# Patient Record
Sex: Female | Born: 1980 | Race: White | Marital: Married | Smoking: Never smoker
Health system: Southern US, Community
[De-identification: ages and names within clinical notes are randomized; demographics above are authoritative.]

## PROBLEM LIST (undated history)

## (undated) DIAGNOSIS — R42 Dizziness and giddiness: Secondary | ICD-10-CM

## (undated) DIAGNOSIS — F419 Anxiety disorder, unspecified: Secondary | ICD-10-CM

## (undated) HISTORY — DX: Dizziness and giddiness: R42

## (undated) HISTORY — DX: Anxiety disorder, unspecified: F41.9

---

## 2004-05-09 ENCOUNTER — Emergency Department: Payer: Self-pay | Admitting: Emergency Medicine

## 2005-04-25 ENCOUNTER — Other Ambulatory Visit: Payer: Self-pay

## 2005-04-25 ENCOUNTER — Emergency Department: Payer: Self-pay | Admitting: Emergency Medicine

## 2011-05-03 DIAGNOSIS — R42 Dizziness and giddiness: Secondary | ICD-10-CM

## 2011-05-03 HISTORY — DX: Dizziness and giddiness: R42

## 2011-05-24 ENCOUNTER — Ambulatory Visit (INDEPENDENT_AMBULATORY_CARE_PROVIDER_SITE_OTHER): Payer: BC Managed Care – PPO | Admitting: Obstetrics and Gynecology

## 2011-05-24 ENCOUNTER — Encounter: Payer: Self-pay | Admitting: Obstetrics and Gynecology

## 2011-05-24 ENCOUNTER — Inpatient Hospital Stay (HOSPITAL_COMMUNITY): Admission: RE | Admit: 2011-05-24 | Payer: Self-pay | Source: Ambulatory Visit | Admitting: Obstetrics and Gynecology

## 2011-05-24 VITALS — BP 109/70 | HR 75 | Ht 62.0 in | Wt 136.0 lb

## 2011-05-24 DIAGNOSIS — Z01419 Encounter for gynecological examination (general) (routine) without abnormal findings: Secondary | ICD-10-CM

## 2011-05-24 DIAGNOSIS — Z1272 Encounter for screening for malignant neoplasm of vagina: Secondary | ICD-10-CM

## 2011-05-24 MED ORDER — NORETHIN ACE-ETH ESTRAD-FE 1-20 MG-MCG PO TABS: 1.0000 | ORAL_TABLET | Freq: Every day | ORAL | Status: DC

## 2011-05-24 NOTE — Patient Instructions (Signed)
Preventative Care for Adults, Female A healthy lifestyle and preventative care can promote health and wellness. Preventative health guidelines for women include the following key practices:  A routine yearly physical is a good way to check with your caregiver about your health and preventative screening. It is a chance to share any concerns and updates on your health, and to receive a thorough exam.   Visit your dentist for a routine exam and preventative care every 6 months. Brush your teeth twice a day and floss once a day. Good oral hygiene prevents tooth decay and gum disease.   The frequency of eye exams is based on your age, health, family medical history, use of contact lenses, and other factors. Follow your caregiver's recommendations for frequency of eye exams.   Eat a healthy diet. Foods like vegetables, fruits, whole grains, low-fat dairy products, and lean protein foods contain the nutrients you need without too many calories. Decrease your intake of foods high in solid fats, added sugars, and salt. Eat the right amount of calories for you.Get information about a proper diet from your caregiver, if necessary.   Regular physical exercise is one of the most important things you can do for your health. Most adults should get at least 150 minutes of moderate-intensity exercise (any activity that increases your heart rate and causes you to sweat) each week. In addition, most adults need muscle-strengthening exercises on 2 or more days a week.   Maintain a healthy weight. The body mass index (BMI) is a screening tool to identify possible weight problems. It provides an estimate of body fat based on height and weight. Your caregiver can help determine your BMI, and can help you achieve or maintain a healthy weight.For adults 20 years and older:   A BMI below 18.5 is considered underweight.   A BMI of 18.5 to 24.9 is normal.   A BMI of 25 to 29.9 is considered overweight.   A BMI of 30 and  above is considered obese.   Maintain normal blood lipids and cholesterol levels by exercising and minimizing your intake of saturated fat. Eat a balanced diet with plenty of fruit and vegetables. Blood tests for lipids and cholesterol should begin at age 20 and be repeated every 5 years. If your lipid or cholesterol levels are high, you are over 50, or you are a high risk for heart disease, you may need your cholesterol levels checked more frequently.Ongoing high lipid and cholesterol levels should be treated with medicines if diet and exercise are not effective.   If you smoke, find out from your caregiver how to quit. If you do not use tobacco, do not start.   If you are pregnant, do not drink alcohol. If you are breastfeeding, be very cautious about drinking alcohol. If you are not pregnant and choose to drink alcohol, do not exceed 1 drink per day. One drink is considered to be 12 ounces (355 mL) of beer, 5 ounces (148 mL) of wine, or 1.5 ounces (44 mL) of liquor.   Avoid use of street drugs. Do not share needles with anyone. Ask for help if you need support or instructions about stopping the use of drugs.   High blood pressure causes heart disease and increases the risk of stroke. Your blood pressure should be checked at least every 1 to 2 years. Ongoing high blood pressure should be treated with medicines if weight loss and exercise are not effective.   If you are 55 to 32   years old, ask your caregiver if you should take aspirin to prevent strokes.   Diabetes screening involves taking a blood sample to check your fasting blood sugar level. This should be done once every 3 years, after age 45, if you are within normal weight and without risk factors for diabetes. Testing should be considered at a younger age or be carried out more frequently if you are overweight and have at least 1 risk factor for diabetes.   Breast cancer screening is essential preventative care for women. You should  practice "breast self-awareness." This means understanding the normal appearance and feel of your breasts and may include breast self-examination. Any changes detected, no matter how small, should be reported to a caregiver. Women in their 20s and 30s should have a clinical breast exam (CBE) by a caregiver as part of a regular health exam every 1 to 3 years. After age 40, women should have a CBE every year. Starting at age 40, women should consider having a mammogram (breast X-ray) every year. Women who have a family history of breast cancer should talk to their caregiver about genetic screening. Women at a high risk of breast cancer should talk to their caregiver about having an MRI and a mammogram every year.   The Pap test is a screening test for cervical cancer. A Pap test can show cell changes on the cervix that might become cervical cancer if left untreated. A Pap test is a procedure in which cells are obtained and examined from the lower end of the uterus (cervix).   Women should have a Pap test starting at age 21.   Between ages 21 and 29, Pap tests should be repeated every 2 years.   Beginning at age 30, you should have a Pap test every 3 years as long as the past 3 Pap tests have been normal.   Some women have medical problems that increase the chance of getting cervical cancer. Talk to your caregiver about these problems. It is especially important to talk to your caregiver if a new problem develops soon after your last Pap test. In these cases, your caregiver may recommend more frequent screening and Pap tests.   The above recommendations are the same for women who have or have not gotten the vaccine for human papillomavirus (HPV).   If you had a hysterectomy for a problem that was not cancer or a condition that could lead to cancer, then you no longer need Pap tests. Even if you no longer need a Pap test, a regular exam is a good idea to make sure no other problems are starting.   If you  are between ages 65 and 70, and you have had normal Pap tests going back 10 years, you no longer need Pap tests. Even if you no longer need a Pap test, a regular exam is a good idea to make sure no other problems are starting.   If you have had past treatment for cervical cancer or a condition that could lead to cancer, you need Pap tests and screening for cancer for at least 20 years after your treatment.   If Pap tests have been discontinued, risk factors (such as a new sexual partner) need to be reassessed to determine if screening should be resumed.   The HPV test is an additional test that may be used for cervical cancer screening. The HPV test looks for the virus that can cause the cell changes on the cervix. The cells collected   during the Pap test can be tested for HPV. The HPV test could be used to screen women aged 30 years and older, and should be used in women of any age who have unclear Pap test results. After the age of 30, women should have HPV testing at the same frequency as a Pap test.   Colorectal cancer can be detected and often prevented. Most routine colorectal cancer screening begins at the age of 50 and continues through age 75. However, your caregiver may recommend screening at an earlier age if you have risk factors for colon cancer. On a yearly basis, your caregiver may provide home test kits to check for hidden blood in the stool. Use of a small camera at the end of a tube, to directly examine the colon (sigmoidoscopy or colonoscopy), can detect the earliest forms of colorectal cancer. Talk to your caregiver about this at age 50, when routine screening begins. Direct examination of the colon should be repeated every 5 to 10 years through age 75, unless early forms of pre-cancerous polyps or small growths are found.   Practice safe sex. Use condoms and avoid high-risk sexual practices to reduce the spread of sexually transmitted infections (STIs). STIs include gonorrhea,  chlamydia, syphilis, trichomonas, herpes, HPV, and human immunodeficiency virus (HIV). Herpes, HIV, and HPV are viral illnesses that have no cure. They can result in disability, cancer, and death. Sexually active women aged 25 and younger should be checked for Chlamydia. Older women with new or multiple partners should also be tested for Chlamydia. Testing for other STIs is recommended if you are sexually active and at increased risk.   Osteoporosis is a disease in which the bones lose minerals and strength with aging. This can result in serious bone fractures. The risk of osteoporosis can be identified using a bone density scan. Women ages 65 and over and women at risk for fractures or osteoporosis should discuss screening with their caregivers. Ask your caregiver whether you should take a calcium supplement or vitamin D to reduce the rate of osteoporosis.   Menopause can be associated with physical symptoms and risks. Hormone replacement therapy is available to decrease symptoms and risks. You should talk to your caregiver about whether hormone replacement therapy is right for you.   Use sunscreen with skin protection factor (SPF) of 30 or more. Apply sunscreen liberally and repeatedly throughout the day. You should seek shade when your shadow is shorter than you. Protect yourself by wearing long sleeves, pants, a wide-brimmed hat, and sunglasses year round, whenever you are outdoors.   Once a month, do a whole body skin exam, using a mirror to look at the skin on your back. Notify your caregiver of new moles, moles that have irregular borders, moles that are larger than a pencil eraser, or moles that have changed in shape or color.   Stay current with required immunizations.   Influenza. You need a dose every fall (or winter). The composition of the flu vaccine changes each year, so being vaccinated once is not enough.   Pneumococcal polysaccharide. You need 1 to 2 doses if you smoke cigarettes or  if you have certain chronic medical conditions. You need 1 dose at age 65 (or older) if you have never been vaccinated.   Tetanus, diphtheria, pertussis (Tdap, Td). Get 1 dose of Tdap vaccine if you are younger than age 65 years, are over 65 and have contact with an infant, are a healthcare worker, are pregnant, or simply want   to be protected from whooping cough. After that, you need a Td booster dose every 10 years. Consult your caregiver if you have not had at least 3 tetanus and diphtheria-containing shots sometime in your life or have a deep or dirty wound.   HPV. You need this vaccine if you are a woman age 24 years or younger. The vaccine is given in 3 doses over 6 months.   Measles, mumps, rubella (MMR). You need at least 1 dose of MMR if you were born in 1957 or later. You may also need a 2nd dose.   Meningococcal. If you are age 78 to 97 years and a Orthoptist living in a residence hall, or have one of several medical conditions, you need to get vaccinated against meningococcal disease. You may also need additional booster doses.   Zoster (shingles). If you are age 21 years or older, you should get this vaccine.   Varicella (chickenpox). If you have never had chickenpox or you were vaccinated but received only 1 dose, talk to your caregiver to find out if you need this vaccine.   Hepatitis A. You need this vaccine if you have a specific risk factor for hepatitis A virus infection or you simply wish to be protected from this disease. The vaccine is usually given as 2 doses, 6 to 18 months apart.   Hepatitis B. You need this vaccine if you have a specific risk factor for hepatitis B virus infection or you simply wish to be protected from this disease. The vaccine is given in 3 doses, usually over 6 months.  Preventative Services / Frequency Ages 87 to 34  Blood pressure check.** / Every 1 to 2 years.   Lipid and cholesterol check.**/ Every 5 years beginning at age 15.    Clinical breast exam.** / Every 3 years for women in their 37s and 30s.   Pap Test.** / Every 2 years from ages 34 through 45. Every 3 years starting at age 66 years through age 25 or 76 with a history of 3 consecutive normal Pap tests.   HPV Screening.** / Every 3 years from ages 46 through ages 24 to 26 with a history of 3 consecutive normal Pap tests.   Skin self-exam. / Monthly.   Influenza immunization.** / Every year.   Pneumococcal polysaccharide immunization.** / 1 to 2 doses if you smoke cigarettes or if you have certain chronic medical conditions.   Tetanus, diphtheria, pertussis (Tdap,Td) immunization. / A one-time dose of Tdap vaccine. After that, you need a Td booster dose every 10 years.   HPV immunization. / 3 doses over 6 months, if 26 and younger.   Measles, mumps, rubella (MMR) immunization. / You need at least 1 dose of MMR if you were born in 1957 or later. You may also need a 2nd dose.   Meningococcal immunization. / 1 dose if you are age 34 to 18 years and a Orthoptist living in a residence hall, or have one of several medical conditions, you need to get vaccinated against meningococcal disease. You may also need additional booster doses.   Varicella immunization. **/ Consult your caregiver.   Hepatitis A immunization. ** / Consult your caregiver. 2 doses, 6 to 18 months apart.   Hepatitis B immunization.** / Consult your caregiver. 3 doses usually over 6 months.    ** Family history and personal history of risk and conditions may change your caregiver's recommendations. Document Released: 06/14/2001 Document Revised: 12/29/2010 Document  Reviewed: 09/13/2010 [employer] Patient Information 2012 [place], [place].

## 2011-05-24 NOTE — Progress Notes (Signed)
  Subjective:     Stephanie Taylor is a 31 y.o. female G1P1 with LMP 05/17/2011 with BMI 24 and is here for a comprehensive physical exam. The patient reports no problems. Patient using Lo-Loestrin for birth control and is happy with this current method. Patient is otherwise without any complaints. Patient is in a monogamous relationship and is not interested in STD testing  History   Social History  . Marital Status: Married    Spouse Name: N/A    Number of Children: N/A  . Years of Education: N/A   Occupational History  . Not on file.   Social History Main Topics  . Smoking status: Never Smoker   . Smokeless tobacco: Not on file  . Alcohol Use: Yes     occasion  . Drug Use: No  . Sexually Active: Yes -- Female partner(s)    Birth Control/ Protection: Pill   Other Topics Concern  . Not on file   Social History Narrative  . No narrative on file   No health maintenance topics applied.     Review of Systems A comprehensive review of systems was negative.   Objective:   GENERAL: Well-developed, well-nourished female in no acute distress.  HEENT: Normocephalic, atraumatic. Sclerae anicteric.  NECK: Supple. Normal thyroid.  LUNGS: Clear to auscultation bilaterally.  HEART: Regular rate and rhythm. BREASTS: Symmetric in size. No palpable masses or lymphadenopathy, skin changes, or nipple drainage. ABDOMEN: Soft, nontender, nondistended. No organomegaly. PELVIC: Normal external female genitalia. Vagina is pink and rugated.  Normal discharge. Normal appearing cervix. Uterus is normal in size. No adnexal mass or tenderness. EXTREMITIES: No cyanosis, clubbing, or edema, 2+ distal pulses.    Assessment:    Healthy female exam.     Plan:   Pap smear was performed Patient provided with refill of birth control pill Patient advised to perform monthly breast exams Patient advised to exercise regularly and eat high fiber/low fat diet RTC in a year or prn See After Visit  Summary for Counseling Recommendations

## 2011-06-02 ENCOUNTER — Telehealth: Payer: Self-pay | Admitting: *Deleted

## 2011-06-02 NOTE — Telephone Encounter (Signed)
Paperwork for med refill filled out and faxed

## 2011-08-04 ENCOUNTER — Other Ambulatory Visit: Payer: Self-pay | Admitting: Gynecology

## 2011-08-04 DIAGNOSIS — Z309 Encounter for contraceptive management, unspecified: Secondary | ICD-10-CM

## 2011-08-04 MED ORDER — NORETHIN ACE-ETH ESTRAD-FE 1-20 MG-MCG PO TABS: 1.0000 | ORAL_TABLET | Freq: Every day | ORAL | Status: DC

## 2012-06-15 ENCOUNTER — Ambulatory Visit: Payer: BC Managed Care – PPO | Admitting: Obstetrics and Gynecology

## 2012-06-21 ENCOUNTER — Encounter: Payer: Self-pay | Admitting: Family Medicine

## 2012-06-21 ENCOUNTER — Ambulatory Visit (INDEPENDENT_AMBULATORY_CARE_PROVIDER_SITE_OTHER): Payer: BC Managed Care – PPO | Admitting: Family Medicine

## 2012-06-21 VITALS — BP 111/69 | HR 75 | Ht 63.0 in | Wt 141.0 lb

## 2012-06-21 DIAGNOSIS — Z124 Encounter for screening for malignant neoplasm of cervix: Secondary | ICD-10-CM

## 2012-06-21 DIAGNOSIS — Z3041 Encounter for surveillance of contraceptive pills: Secondary | ICD-10-CM | POA: Insufficient documentation

## 2012-06-21 DIAGNOSIS — Z01419 Encounter for gynecological examination (general) (routine) without abnormal findings: Secondary | ICD-10-CM

## 2012-06-21 DIAGNOSIS — Z1151 Encounter for screening for human papillomavirus (HPV): Secondary | ICD-10-CM

## 2012-06-21 MED ORDER — INFLUENZA VIRUS VACC SPLIT PF IM SUSP: 0.5000 mL | Freq: Once | INTRAMUSCULAR | Status: DC

## 2012-06-21 NOTE — Patient Instructions (Signed)
Preventive Care for Adults, Female A healthy lifestyle and preventive care can promote health and wellness. Preventive health guidelines for women include the following key practices.  A routine yearly physical is a good way to check with your caregiver about your health and preventive screening. It is a chance to share any concerns and updates on your health, and to receive a thorough exam.  Visit your dentist for a routine exam and preventive care every 6 months. Brush your teeth twice a day and floss once a day. Good oral hygiene prevents tooth decay and gum disease.  The frequency of eye exams is based on your age, health, family medical history, use of contact lenses, and other factors. Follow your caregiver's recommendations for frequency of eye exams.  Eat a healthy diet. Foods like vegetables, fruits, whole grains, low-fat dairy products, and lean protein foods contain the nutrients you need without too many calories. Decrease your intake of foods high in solid fats, added sugars, and salt. Eat the right amount of calories for you.Get information about a proper diet from your caregiver, if necessary.  Regular physical exercise is one of the most important things you can do for your health. Most adults should get at least 150 minutes of moderate-intensity exercise (any activity that increases your heart rate and causes you to sweat) each week. In addition, most adults need muscle-strengthening exercises on 2 or more days a week.  Maintain a healthy weight. The body mass index (BMI) is a screening tool to identify possible weight problems. It provides an estimate of body fat based on height and weight. Your caregiver can help determine your BMI, and can help you achieve or maintain a healthy weight.For adults 20 years and older:  A BMI below 18.5 is considered underweight.  A BMI of 18.5 to 24.9 is normal.  A BMI of 25 to 29.9 is considered overweight.  A BMI of 30 and above is  considered obese.  Maintain normal blood lipids and cholesterol levels by exercising and minimizing your intake of saturated fat. Eat a balanced diet with plenty of fruit and vegetables. Blood tests for lipids and cholesterol should begin at age 20 and be repeated every 5 years. If your lipid or cholesterol levels are high, you are over 50, or you are at high risk for heart disease, you may need your cholesterol levels checked more frequently.Ongoing high lipid and cholesterol levels should be treated with medicines if diet and exercise are not effective.  If you smoke, find out from your caregiver how to quit. If you do not use tobacco, do not start.  If you are pregnant, do not drink alcohol. If you are breastfeeding, be very cautious about drinking alcohol. If you are not pregnant and choose to drink alcohol, do not exceed 1 drink per day. One drink is considered to be 12 ounces (355 mL) of beer, 5 ounces (148 mL) of wine, or 1.5 ounces (44 mL) of liquor.  Avoid use of street drugs. Do not share needles with anyone. Ask for help if you need support or instructions about stopping the use of drugs.  High blood pressure causes heart disease and increases the risk of stroke. Your blood pressure should be checked at least every 1 to 2 years. Ongoing high blood pressure should be treated with medicines if weight loss and exercise are not effective.  If you are 55 to 33 years old, ask your caregiver if you should take aspirin to prevent strokes.  Diabetes   screening involves taking a blood sample to check your fasting blood sugar level. This should be done once every 3 years, after age 45, if you are within normal weight and without risk factors for diabetes. Testing should be considered at a younger age or be carried out more frequently if you are overweight and have at least 1 risk factor for diabetes.  Breast cancer screening is essential preventive care for women. You should practice "breast  self-awareness." This means understanding the normal appearance and feel of your breasts and may include breast self-examination. Any changes detected, no matter how small, should be reported to a caregiver. Women in their 20s and 30s should have a clinical breast exam (CBE) by a caregiver as part of a regular health exam every 1 to 3 years. After age 40, women should have a CBE every year. Starting at age 40, women should consider having a mammography (breast X-ray test) every year. Women who have a family history of breast cancer should talk to their caregiver about genetic screening. Women at a high risk of breast cancer should talk to their caregivers about having magnetic resonance imaging (MRI) and a mammography every year.  The Pap test is a screening test for cervical cancer. A Pap test can show cell changes on the cervix that might become cervical cancer if left untreated. A Pap test is a procedure in which cells are obtained and examined from the lower end of the uterus (cervix).  Women should have a Pap test starting at age 21.  Between ages 21 and 29, Pap tests should be repeated every 2 years.  Beginning at age 30, you should have a Pap test every 3 years as long as the past 3 Pap tests have been normal.  Some women have medical problems that increase the chance of getting cervical cancer. Talk to your caregiver about these problems. It is especially important to talk to your caregiver if a new problem develops soon after your last Pap test. In these cases, your caregiver may recommend more frequent screening and Pap tests.  The above recommendations are the same for women who have or have not gotten the vaccine for human papillomavirus (HPV).  If you had a hysterectomy for a problem that was not cancer or a condition that could lead to cancer, then you no longer need Pap tests. Even if you no longer need a Pap test, a regular exam is a good idea to make sure no other problems are  starting.  If you are between ages 65 and 70, and you have had normal Pap tests going back 10 years, you no longer need Pap tests. Even if you no longer need a Pap test, a regular exam is a good idea to make sure no other problems are starting.  If you have had past treatment for cervical cancer or a condition that could lead to cancer, you need Pap tests and screening for cancer for at least 20 years after your treatment.  If Pap tests have been discontinued, risk factors (such as a new sexual partner) need to be reassessed to determine if screening should be resumed.  The HPV test is an additional test that may be used for cervical cancer screening. The HPV test looks for the virus that can cause the cell changes on the cervix. The cells collected during the Pap test can be tested for HPV. The HPV test could be used to screen women aged 30 years and older, and should   be used in women of any age who have unclear Pap test results. After the age of 30, women should have HPV testing at the same frequency as a Pap test.  Colorectal cancer can be detected and often prevented. Most routine colorectal cancer screening begins at the age of 50 and continues through age 75. However, your caregiver may recommend screening at an earlier age if you have risk factors for colon cancer. On a yearly basis, your caregiver may provide home test kits to check for hidden blood in the stool. Use of a small camera at the end of a tube, to directly examine the colon (sigmoidoscopy or colonoscopy), can detect the earliest forms of colorectal cancer. Talk to your caregiver about this at age 50, when routine screening begins. Direct examination of the colon should be repeated every 5 to 10 years through age 75, unless early forms of pre-cancerous polyps or small growths are found.  Hepatitis C blood testing is recommended for all people born from 1945 through 1965 and any individual with known risks for hepatitis C.  Practice  safe sex. Use condoms and avoid high-risk sexual practices to reduce the spread of sexually transmitted infections (STIs). STIs include gonorrhea, chlamydia, syphilis, trichomonas, herpes, HPV, and human immunodeficiency virus (HIV). Herpes, HIV, and HPV are viral illnesses that have no cure. They can result in disability, cancer, and death. Sexually active women aged 25 and younger should be checked for chlamydia. Older women with new or multiple partners should also be tested for chlamydia. Testing for other STIs is recommended if you are sexually active and at increased risk.  Osteoporosis is a disease in which the bones lose minerals and strength with aging. This can result in serious bone fractures. The risk of osteoporosis can be identified using a bone density scan. Women ages 65 and over and women at risk for fractures or osteoporosis should discuss screening with their caregivers. Ask your caregiver whether you should take a calcium supplement or vitamin D to reduce the rate of osteoporosis.  Menopause can be associated with physical symptoms and risks. Hormone replacement therapy is available to decrease symptoms and risks. You should talk to your caregiver about whether hormone replacement therapy is right for you.  Use sunscreen with sun protection factor (SPF) of 30 or more. Apply sunscreen liberally and repeatedly throughout the day. You should seek shade when your shadow is shorter than you. Protect yourself by wearing long sleeves, pants, a wide-brimmed hat, and sunglasses year round, whenever you are outdoors.  Once a month, do a whole body skin exam, using a mirror to look at the skin on your back. Notify your caregiver of new moles, moles that have irregular borders, moles that are larger than a pencil eraser, or moles that have changed in shape or color.  Stay current with required immunizations.  Influenza. You need a dose every fall (or winter). The composition of the flu vaccine  changes each year, so being vaccinated once is not enough.  Pneumococcal polysaccharide. You need 1 to 2 doses if you smoke cigarettes or if you have certain chronic medical conditions. You need 1 dose at age 65 (or older) if you have never been vaccinated.  Tetanus, diphtheria, pertussis (Tdap, Td). Get 1 dose of Tdap vaccine if you are younger than age 65, are over 65 and have contact with an infant, are a healthcare worker, are pregnant, or simply want to be protected from whooping cough. After that, you need a Td   booster dose every 10 years. Consult your caregiver if you have not had at least 3 tetanus and diphtheria-containing shots sometime in your life or have a deep or dirty wound.  HPV. You need this vaccine if you are a woman age 26 or younger. The vaccine is given in 3 doses over 6 months.  Measles, mumps, rubella (MMR). You need at least 1 dose of MMR if you were born in 1957 or later. You may also need a second dose.  Meningococcal. If you are age 19 to 21 and a first-year college student living in a residence hall, or have one of several medical conditions, you need to get vaccinated against meningococcal disease. You may also need additional booster doses.  Zoster (shingles). If you are age 60 or older, you should get this vaccine.  Varicella (chickenpox). If you have never had chickenpox or you were vaccinated but received only 1 dose, talk to your caregiver to find out if you need this vaccine.  Hepatitis A. You need this vaccine if you have a specific risk factor for hepatitis A virus infection or you simply wish to be protected from this disease. The vaccine is usually given as 2 doses, 6 to 18 months apart.  Hepatitis B. You need this vaccine if you have a specific risk factor for hepatitis B virus infection or you simply wish to be protected from this disease. The vaccine is given in 3 doses, usually over 6 months. Preventive Services / Frequency Ages 19 to 39  Blood  pressure check.** / Every 1 to 2 years.  Lipid and cholesterol check.** / Every 5 years beginning at age 20.  Clinical breast exam.** / Every 3 years for women in their 20s and 30s.  Pap test.** / Every 2 years from ages 21 through 29. Every 3 years starting at age 30 through age 65 or 70 with a history of 3 consecutive normal Pap tests.  HPV screening.** / Every 3 years from ages 30 through ages 65 to 70 with a history of 3 consecutive normal Pap tests.  Hepatitis C blood test.** / For any individual with known risks for hepatitis C.  Skin self-exam. / Monthly.  Influenza immunization.** / Every year.  Pneumococcal polysaccharide immunization.** / 1 to 2 doses if you smoke cigarettes or if you have certain chronic medical conditions.  Tetanus, diphtheria, pertussis (Tdap, Td) immunization. / A one-time dose of Tdap vaccine. After that, you need a Td booster dose every 10 years.  HPV immunization. / 3 doses over 6 months, if you are 26 and younger.  Measles, mumps, rubella (MMR) immunization. / You need at least 1 dose of MMR if you were born in 1957 or later. You may also need a second dose.  Meningococcal immunization. / 1 dose if you are age 19 to 21 and a first-year college student living in a residence hall, or have one of several medical conditions, you need to get vaccinated against meningococcal disease. You may also need additional booster doses.  Varicella immunization.** / Consult your caregiver.  Hepatitis A immunization.** / Consult your caregiver. 2 doses, 6 to 18 months apart.  Hepatitis B immunization.** / Consult your caregiver. 3 doses usually over 6 months. Ages 40 to 64  Blood pressure check.** / Every 1 to 2 years.  Lipid and cholesterol check.** / Every 5 years beginning at age 20.  Clinical breast exam.** / Every year after age 40.  Mammogram.** / Every year beginning at age 40   and continuing for as long as you are in good health. Consult with your  caregiver.  Pap test.** / Every 3 years starting at age 30 through age 65 or 70 with a history of 3 consecutive normal Pap tests.  HPV screening.** / Every 3 years from ages 30 through ages 65 to 70 with a history of 3 consecutive normal Pap tests.  Fecal occult blood test (FOBT) of stool. / Every year beginning at age 50 and continuing until age 75. You may not need to do this test if you get a colonoscopy every 10 years.  Flexible sigmoidoscopy or colonoscopy.** / Every 5 years for a flexible sigmoidoscopy or every 10 years for a colonoscopy beginning at age 50 and continuing until age 75.  Hepatitis C blood test.** / For all people born from 1945 through 1965 and any individual with known risks for hepatitis C.  Skin self-exam. / Monthly.  Influenza immunization.** / Every year.  Pneumococcal polysaccharide immunization.** / 1 to 2 doses if you smoke cigarettes or if you have certain chronic medical conditions.  Tetanus, diphtheria, pertussis (Tdap, Td) immunization.** / A one-time dose of Tdap vaccine. After that, you need a Td booster dose every 10 years.  Measles, mumps, rubella (MMR) immunization. / You need at least 1 dose of MMR if you were born in 1957 or later. You may also need a second dose.  Varicella immunization.** / Consult your caregiver.  Meningococcal immunization.** / Consult your caregiver.  Hepatitis A immunization.** / Consult your caregiver. 2 doses, 6 to 18 months apart.  Hepatitis B immunization.** / Consult your caregiver. 3 doses, usually over 6 months. Ages 65 and over  Blood pressure check.** / Every 1 to 2 years.  Lipid and cholesterol check.** / Every 5 years beginning at age 20.  Clinical breast exam.** / Every year after age 40.  Mammogram.** / Every year beginning at age 40 and continuing for as long as you are in good health. Consult with your caregiver.  Pap test.** / Every 3 years starting at age 30 through age 65 or 70 with a 3  consecutive normal Pap tests. Testing can be stopped between 65 and 70 with 3 consecutive normal Pap tests and no abnormal Pap or HPV tests in the past 10 years.  HPV screening.** / Every 3 years from ages 30 through ages 65 or 70 with a history of 3 consecutive normal Pap tests. Testing can be stopped between 65 and 70 with 3 consecutive normal Pap tests and no abnormal Pap or HPV tests in the past 10 years.  Fecal occult blood test (FOBT) of stool. / Every year beginning at age 50 and continuing until age 75. You may not need to do this test if you get a colonoscopy every 10 years.  Flexible sigmoidoscopy or colonoscopy.** / Every 5 years for a flexible sigmoidoscopy or every 10 years for a colonoscopy beginning at age 50 and continuing until age 75.  Hepatitis C blood test.** / For all people born from 1945 through 1965 and any individual with known risks for hepatitis C.  Osteoporosis screening.** / A one-time screening for women ages 65 and over and women at risk for fractures or osteoporosis.  Skin self-exam. / Monthly.  Influenza immunization.** / Every year.  Pneumococcal polysaccharide immunization.** / 1 dose at age 65 (or older) if you have never been vaccinated.  Tetanus, diphtheria, pertussis (Tdap, Td) immunization. / A one-time dose of Tdap vaccine if you are over   65 and have contact with an infant, are a Research scientist (physical sciences), or simply want to be protected from whooping cough. After that, you need a Td booster dose every 10 years.  Varicella immunization.** / Consult your caregiver.  Meningococcal immunization.** / Consult your caregiver.  Hepatitis A immunization.** / Consult your caregiver. 2 doses, 6 to 18 months apart.  Hepatitis B immunization.** / Check with your caregiver. 3 doses, usually over 6 months. ** Family history and personal history of risk and conditions may change your caregiver's recommendations. Document Released: 06/14/2001 Document Revised: 07/11/2011  Document Reviewed: 09/13/2010 [facility] Patient Information 2013 [place], [place]. Sterilization Information, Female Female sterilization is a procedure to permanently prevent pregnancy. There are different ways to perform sterilization, but all either block or close the fallopian tubes so that your eggs cannot reach your uterus. If your egg cannot reach your uterus, sperm cannot fertilize the egg, and you cannot get pregnant.  Sterilization is performed by a surgical procedure. Sometimes these procedures are performed in a hospital while a patient is asleep. Sometimes they can be done in a clinic setting with the patient awake. The fallopian tubes can be surgically cut, tied, or sealed through a procedure called tubal ligation. The fallopian tubes can also be closed with clips or rings. Sterilization can also be done by placing a tiny coil into each fallopian tube, which causes scar tissue to grow inside the tube. The scar tissue then blocks the tubes.  Discuss sterilization with your caregiver to answer any concerns you or your partner may have. You may want to ask what type of sterilization your caregiver performs. Some caregivers may not perform all the various options. Sterilization is permanent and should only be done if you are sure you do not want children or do not want any more children. Having a sterilization reversed may not be successful.  STERILIZATION PROCEDURES  Laparoscopic sterilization. This is a surgical method performed at a time other than right after childbirth. Two incisions are made in the lower abdomen. A thin, lighted tube (laparoscope) is inserted into one of the incisions and is used to perform the procedure. The fallopian tubes are closed with a ring or a clip. An instrument that uses heat could be used to seal the tubes closed (electrocautery).   Mini-laparotomy. This is a surgical method done 1 or 2 days after giving birth. Typically, a small incision is made just below the  belly button (umbilicus) and the fallopian tubes are exposed. The tubes can then be sealed, tied, or cut.   Hysteroscopic sterilization. This is performed at a time other than right after childbirth. A tiny, spring-like coil is inserted through the cervix and uterus and placed into the fallopian tubes. The coil causes scaring and blocks the tubes. Other forms of contraception should be used for 3 months after the procedure to allow the scar tissue to form completely. Additionally, it is required hysterosalpingography be done 3 months later to ensure that the procedure was successful. Hysterosalpingography is a procedure that uses X-rays to look at your uterus and fallopian tubes after a material to make them show up better has been inserted. IS STERILIZATION SAFE? Sterilization is considered safe with very rare complications. Risks depend on the type of procedure you have. As with any surgical procedure, there are risks. Some risks of sterilization by any means include:   Bleeding.  Infection.  Reaction to anesthesia medicine.  Injury to surrounding organs. Risks specific to having hysteroscopic coils placed include:  The coils may not be placed correctly the first time.   The coils may move out of place.   The tubes may not get completely blocked after 3 months.   Injury to surrounding organs when placing the coil.  HOW EFFECTIVE IS FEMALE STERILIZATION? Sterilization is nearly 100% effective, but it can fail. Depending on the type of sterilization, the rate of failure can be as high as 3%. After hysteroscopic sterilization with placement of fallopian tube coils, you will need back-up birth control for 3 months after the procedure. Sterilization is effective for a lifetime.  BENEFITS OF STERILIZATION  It does not affect your hormones, and therefore will not affect your menstrual periods, sexual desire, or performance.   It is effective for a lifetime.   It is safe.   You do  not need to worry about getting pregnant. Keep in mind that if you had the hysteroscopic placement procedure, you must wait 3 months after the procedure (or until your caregiver confirms) before pregnancy is not considered possible.   There are no side effects unlike other types of birth control (contraception).  DRAWBACKS OF STERILIZATION  You must be sure you do not want children or any more children. The procedure is permanent.   It does not provide protection against sexually transmitted infections (STIs).   The tubes can grow back together. If this happens, there is a risk of pregnancy. There is also an increased risk (50%) of pregnancy being an ectopic pregnancy. This is a pregnancy that happens outside of the uterus. Document Released: 10/05/2007 Document Revised: 10/18/2011 Document Reviewed: 08/04/2011 [facility] Patient Information 2013 [place], [place]. Contraception Choices Contraception (birth control) is the use of any methods or devices to prevent pregnancy. Below are some methods to help avoid pregnancy. HORMONAL METHODS   Contraceptive implant. This is a thin, plastic tube containing progesterone hormone. It does not contain estrogen hormone. Your caregiver inserts the tube in the inner part of the upper arm. The tube can remain in place for up to 3 years. After 3 years, the implant must be removed. The implant prevents the ovaries from releasing an egg (ovulation), thickens the cervical mucus which prevents sperm from entering the uterus, and thins the lining of the inside of the uterus.  Progesterone-only injections. These injections are given every 3 months by your caregiver to prevent pregnancy. This synthetic progesterone hormone stops the ovaries from releasing eggs. It also thickens cervical mucus and changes the uterine lining. This makes it harder for sperm to survive in the uterus.  Birth control pills. These pills contain estrogen and progesterone hormone. They work  by stopping the egg from forming in the ovary (ovulation). Birth control pills are prescribed by a caregiver.Birth control pills can also be used to treat heavy periods.  Minipill. This type of birth control pill contains only the progesterone hormone. They are taken every day of each month and must be prescribed by your caregiver.  Birth control patch. The patch contains hormones similar to those in birth control pills. It must be changed once a week and is prescribed by a caregiver.  Vaginal ring. The ring contains hormones similar to those in birth control pills. It is left in the vagina for 3 weeks, removed for 1 week, and then a new one is put back in place. The patient must be comfortable inserting and removing the ring from the vagina.A caregiver's prescription is necessary.  Emergency contraception. Emergency contraceptives prevent pregnancy after unprotected sexual intercourse. This pill  can be taken right after sex or up to 5 days after unprotected sex. It is most effective the sooner you take the pills after having sexual intercourse. Emergency contraceptive pills are available without a prescription. Check with your pharmacist. Do not use emergency contraception as your only form of birth control. BARRIER METHODS   Female condom. This is a thin sheath (latex or rubber) that is worn over the penis during sexual intercourse. It can be used with spermicide to increase effectiveness.  Female condom. This is a soft, loose-fitting sheath that is put into the vagina before sexual intercourse.  Diaphragm. This is a soft, latex, dome-shaped barrier that must be fitted by a caregiver. It is inserted into the vagina, along with a spermicidal jelly. It is inserted before intercourse. The diaphragm should be left in the vagina for 6 to 8 hours after intercourse.  Cervical cap. This is a round, soft, latex or plastic cup that fits over the cervix and must be fitted by a caregiver. The cap can be left  in place for up to 48 hours after intercourse.  Sponge. This is a soft, circular piece of polyurethane foam. The sponge has spermicide in it. It is inserted into the vagina after wetting it and before sexual intercourse.  Spermicides. These are chemicals that kill or block sperm from entering the cervix and uterus. They come in the form of creams, jellies, suppositories, foam, or tablets. They do not require a prescription. They are inserted into the vagina with an applicator before having sexual intercourse. The process must be repeated every time you have sexual intercourse. INTRAUTERINE CONTRACEPTION  Intrauterine device (IUD). This is a T-shaped device that is put in a woman's uterus during a menstrual period to prevent pregnancy. There are 2 types:  Copper IUD. This type of IUD is wrapped in copper wire and is placed inside the uterus. Copper makes the uterus and fallopian tubes produce a fluid that kills sperm. It can stay in place for 10 years.  Hormone IUD. This type of IUD contains the hormone progestin (synthetic progesterone). The hormone thickens the cervical mucus and prevents sperm from entering the uterus, and it also thins the uterine lining to prevent implantation of a fertilized egg. The hormone can weaken or kill the sperm that get into the uterus. It can stay in place for 5 years. PERMANENT METHODS OF CONTRACEPTION  Female tubal ligation. This is when the woman's fallopian tubes are surgically sealed, tied, or blocked to prevent the egg from traveling to the uterus.  Female sterilization. This is when the female has the tubes that carry sperm tied off (vasectomy).This blocks sperm from entering the vagina during sexual intercourse. After the procedure, the man can still ejaculate fluid (semen). NATURAL PLANNING METHODS  Natural family planning. This is not having sexual intercourse or using a barrier method (condom, diaphragm, cervical cap) on days the woman could become  pregnant.  Calendar method. This is keeping track of the length of each menstrual cycle and identifying when you are fertile.  Ovulation method. This is avoiding sexual intercourse during ovulation.  Symptothermal method. This is avoiding sexual intercourse during ovulation, using a thermometer and ovulation symptoms.  Post-ovulation method. This is timing sexual intercourse after you have ovulated. Regardless of which type or method of contraception you choose, it is important that you use condoms to protect against the transmission of sexually transmitted diseases (STDs). Talk with your caregiver about which form of contraception is most appropriate for you.  Document Released: 04/18/2005 Document Revised: 07/11/2011 Document Reviewed: 08/25/2010 [facility] Patient Information 2013 [place], [place].

## 2012-06-21 NOTE — Progress Notes (Signed)
  Subjective:     Stephanie Taylor is a 32 y.o. female and is here for a comprehensive physical exam. The patient reports no problems. Considering more permanent sterilization.  Options discussed.  Also, discussed cycle control..the patient will consider options.  History   Social History  . Marital Status: Married    Spouse Name: N/A    Number of Children: N/A  . Years of Education: N/A   Occupational History  . Not on file.   Social History Main Topics  . Smoking status: Never Smoker   . Smokeless tobacco: Not on file  . Alcohol Use: Yes     Comment: occasion  . Drug Use: No  . Sexually Active: Yes -- Female partner(s)    Birth Control/ Protection: Pill   Other Topics Concern  . Not on file   Social History Narrative  . No narrative on file   Health Maintenance  Topic Date Due  . Influenza Vaccine  12/31/1980  . Tetanus/tdap  04/16/1999  . Pap Smear  05/23/2014    The following portions of the patient's history were reviewed and updated as appropriate: allergies, current medications, past family history, past medical history, past social history, past surgical history and problem list.  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  Review of Systems A comprehensive review of systems was negative.   Objective:    BP 111/69  Pulse 75  Ht 5\' 3"  (1.6 m)  Wt 141 lb (63.957 kg)  BMI 24.98 kg/m2  LMP 06/05/2012 General appearance: alert, cooperative and appears stated age Head: Normocephalic, without obvious abnormality, atraumatic Neck: no adenopathy, supple, symmetrical, trachea midline and thyroid not enlarged, symmetric, no tenderness/mass/nodules Lungs: clear to auscultation bilaterally Breasts: normal appearance, no masses or tenderness Heart: regular rate and rhythm, S1, S2 normal, no murmur, click, rub or gallop Abdomen: soft, non-tender; bowel sounds normal; no masses,  no organomegaly Pelvic: cervix normal in appearance, external genitalia normal, no adnexal  masses or tenderness, no cervical motion tenderness, uterus normal size, shape, and consistency and vagina normal without discharge Extremities: extremities normal, atraumatic, no cyanosis or edema Pulses: 2+ and symmetric Skin: Skin color, texture, turgor normal. No rashes or lesions Lymph nodes: Cervical, supraclavicular, and axillary nodes normal. Neurologic: Grossly normal    Assessment:    Healthy female exam. Considering permanent sterilization.      Plan:    pap smear, annual blood work.  Verbal and written information provided about sterilization.  Pt. Will continue OC's for now. See After Visit Summary for Counseling Recommendations

## 2012-09-17 ENCOUNTER — Telehealth: Payer: Self-pay | Admitting: *Deleted

## 2012-09-17 ENCOUNTER — Other Ambulatory Visit: Payer: Self-pay | Admitting: *Deleted

## 2012-09-17 DIAGNOSIS — IMO0001 Reserved for inherently not codable concepts without codable children: Secondary | ICD-10-CM

## 2012-09-17 MED ORDER — NORETHIN ACE-ETH ESTRAD-FE 1-20 MG-MCG PO TABS: 1.0000 | ORAL_TABLET | Freq: Every day | ORAL | Status: DC

## 2012-09-17 NOTE — Telephone Encounter (Signed)
Pt called adv pharm is not getting Rx for BCP - [facility] [address] - [place] - adv pt to call to be sure they have Rx ready and then call me back to let me know everything went through.

## 2012-11-22 ENCOUNTER — Telehealth: Payer: Self-pay | Admitting: *Deleted

## 2012-11-22 DIAGNOSIS — IMO0001 Reserved for inherently not codable concepts without codable children: Secondary | ICD-10-CM

## 2012-11-22 MED ORDER — NORETHIN ACE-ETH ESTRAD-FE 1-20 MG-MCG PO TABS: 1.0000 | ORAL_TABLET | Freq: Every day | ORAL | Status: DC

## 2012-11-22 NOTE — Telephone Encounter (Signed)
Patient needs ocp rx called into the pharmacy for her local and mail order.

## 2013-03-07 ENCOUNTER — Other Ambulatory Visit: Payer: Self-pay

## 2013-09-03 ENCOUNTER — Telehealth: Payer: Self-pay

## 2013-09-03 NOTE — Telephone Encounter (Signed)
[facility] for her [facility] gave her a months worth until [facility] fins hers.

## 2014-01-30 ENCOUNTER — Other Ambulatory Visit: Payer: Self-pay | Admitting: *Deleted

## 2014-01-30 DIAGNOSIS — Z3041 Encounter for surveillance of contraceptive pills: Secondary | ICD-10-CM

## 2014-01-30 MED ORDER — NORETHIN ACE-ETH ESTRAD-FE 1-20 MG-MCG PO TABS: 1.0000 | ORAL_TABLET | Freq: Every day | ORAL | Status: DC

## 2014-01-30 NOTE — Telephone Encounter (Signed)
Pharmacy sent request for refill for ocp. Not due for physical exam until Feb.

## 2014-03-03 ENCOUNTER — Encounter: Payer: Self-pay | Admitting: Family Medicine

## 2015-03-25 ENCOUNTER — Telehealth: Payer: Self-pay | Admitting: *Deleted

## 2015-03-25 DIAGNOSIS — Z3041 Encounter for surveillance of contraceptive pills: Secondary | ICD-10-CM

## 2015-03-25 MED ORDER — NORETHIN ACE-ETH ESTRAD-FE 1-20 MG-MCG PO TABS
1.0000 | ORAL_TABLET | Freq: Every day | ORAL | Status: DC
Start: 2015-03-25 — End: 2015-04-28

## 2015-03-25 NOTE — Telephone Encounter (Signed)
Requesting refill on birth control, sent 1 refill to mail service.  Pt will schedule appointment with Dr Shawnie PonsPratt for Annual Exam.

## 2015-04-10 ENCOUNTER — Ambulatory Visit: Payer: Self-pay | Admitting: Family Medicine

## 2015-04-28 ENCOUNTER — Ambulatory Visit (INDEPENDENT_AMBULATORY_CARE_PROVIDER_SITE_OTHER): Payer: BLUE CROSS/BLUE SHIELD | Admitting: Obstetrics & Gynecology

## 2015-04-28 ENCOUNTER — Encounter: Payer: Self-pay | Admitting: Obstetrics & Gynecology

## 2015-04-28 VITALS — BP 109/74 | HR 81 | Resp 18 | Ht 62.0 in | Wt 153.0 lb

## 2015-04-28 DIAGNOSIS — Z0000 Encounter for general adult medical examination without abnormal findings: Secondary | ICD-10-CM

## 2015-04-28 DIAGNOSIS — Z124 Encounter for screening for malignant neoplasm of cervix: Secondary | ICD-10-CM

## 2015-04-28 DIAGNOSIS — Z3041 Encounter for surveillance of contraceptive pills: Secondary | ICD-10-CM

## 2015-04-28 DIAGNOSIS — Z01419 Encounter for gynecological examination (general) (routine) without abnormal findings: Secondary | ICD-10-CM | POA: Diagnosis not present

## 2015-04-28 DIAGNOSIS — Z1151 Encounter for screening for human papillomavirus (HPV): Secondary | ICD-10-CM

## 2015-04-28 MED ORDER — NORETHIN ACE-ETH ESTRAD-FE 1-20 MG-MCG PO TABS: 1.0000 | ORAL_TABLET | Freq: Every day | ORAL | Status: DC

## 2015-04-28 MED ORDER — METRONIDAZOLE 500 MG PO TABS: 500.0000 mg | ORAL_TABLET | Freq: Two times a day (BID) | ORAL | Status: DC

## 2015-04-28 MED ORDER — MISOPROSTOL 200 MCG PO TABS: ORAL_TABLET | ORAL | Status: DC

## 2015-04-28 NOTE — Addendum Note (Signed)
Addended by: Allie BossierVE, Faheem Ziemann C on: 04/28/2015 10:09 AM   Modules accepted: Orders

## 2015-04-28 NOTE — Progress Notes (Signed)
Subjective:    Stephanie Taylor is a 34 y.o. MW P1 34(34 yo girl) female who presents for an annual exam. The patient has no complaints today. The patient is sexually active. GYN screening history: last pap: was normal. The patient wears seatbelts: yes. The patient participates in regular exercise: yes. Has the patient ever been transfused or tattooed?: yes. The patient reports that there is not domestic violence in her life.   Menstrual History: OB History    Gravida Para Term Preterm AB TAB SAB Ectopic Multiple Living   1 1        1       Menarche age: [age]  Patient's last menstrual period was 04/06/2015.    The following portions of the patient's history were reviewed and updated as appropriate: allergies, current medications, past family history, past medical history, past social history, past surgical history and problem list.  Review of Systems Pertinent items are noted in HPI. Works at [employer], Bankerdept manager. Married for 8 years, already had a flu vaccine this season.   Objective:    BP 109/74 mmHg  Pulse 81  Resp 18  Ht 5\' 2"  (1.575 m)  Wt 153 lb (69.4 kg)  BMI 27.98 kg/m2  LMP 04/06/2015  General Appearance:    Alert, cooperative, no distress, appears stated age  Head:    Normocephalic, without obvious abnormality, atraumatic  Eyes:    PERRL, conjunctiva/corneas clear, EOM's intact, fundi    benign, both eyes  Ears:    Normal TM's and external ear canals, both ears  Nose:   Nares normal, septum midline, mucosa normal, no drainage    or sinus tenderness  Throat:   Lips, mucosa, and tongue normal; teeth and gums normal  Neck:   Supple, symmetrical, trachea midline, no adenopathy;    thyroid:  no enlargement/tenderness/nodules; no carotid   bruit or JVD  Back:     Symmetric, no curvature, ROM normal, no CVA tenderness  Lungs:     Clear to auscultation bilaterally, respirations unlabored  Chest Wall:    No tenderness or deformity   Heart:    Regular rate and  rhythm, S1 and S2 normal, no murmur, rub   or gallop  Breast Exam:    No tenderness, masses, or nipple abnormality  Abdomen:     Soft, non-tender, bowel sounds active all four quadrants,    no masses, no organomegaly  Genitalia:    Normal female without lesion, discharge or tenderness, clitoris piercing, NSSA, NT, no pelvic masses palpable, frothy discharge c/w BV     Extremities:   Extremities normal, atraumatic, no cyanosis or edema  Pulses:   2+ and symmetric all extremities  Skin:   Skin color, texture, turgor normal, no rashes or lesions  Lymph nodes:   Cervical, supraclavicular, and axillary nodes normal  Neurologic:   CNII-XII intact, normal strength, sensation and reflexes    throughout  .    Assessment:    Healthy female exam.   Probable BV   Plan:     Breast self exam technique reviewed and patient encouraged to perform self-exam monthly. Thin prep Pap smear. with cotesting (She is aware of ACOG recs) Discussed other forms of contraception as she is forgetting to take all of her pills (Husband uses withdrawal also) Flagyl prescription given with refills OCPs refilled

## 2015-04-29 ENCOUNTER — Telehealth: Payer: Self-pay | Admitting: *Deleted

## 2015-04-29 DIAGNOSIS — Z01812 Encounter for preprocedural laboratory examination: Secondary | ICD-10-CM

## 2015-04-29 LAB — CYTOLOGY - PAP

## 2015-04-29 MED ORDER — MISOPROSTOL 200 MCG PO TABS: ORAL_TABLET | ORAL | Status: DC

## 2015-04-29 NOTE — Telephone Encounter (Signed)
Patient mail order pharmacy called and said the Cytotec was sent to them and they were not able fill it since it was only 3 pills, a bottle could not be opened.  I have resent the pills to patients local pharmacy.

## 2015-06-11 ENCOUNTER — Telehealth: Payer: Self-pay | Admitting: *Deleted

## 2015-06-11 DIAGNOSIS — Z3041 Encounter for surveillance of contraceptive pills: Secondary | ICD-10-CM

## 2015-06-11 MED ORDER — NORETHIN ACE-ETH ESTRAD-FE 1-20 MG-MCG PO TABS: 1.0000 | ORAL_TABLET | Freq: Every day | ORAL | Status: DC

## 2015-06-11 NOTE — Telephone Encounter (Signed)
Received refill request for birth control from [facility], reordered per original order written in December and sent to the [facility] mail service.

## 2016-04-28 ENCOUNTER — Encounter: Payer: Self-pay | Admitting: Family Medicine

## 2016-04-28 ENCOUNTER — Ambulatory Visit (INDEPENDENT_AMBULATORY_CARE_PROVIDER_SITE_OTHER): Payer: BLUE CROSS/BLUE SHIELD | Admitting: Family Medicine

## 2016-04-28 VITALS — BP 127/90 | HR 93 | Resp 20 | Ht 63.0 in | Wt 158.0 lb

## 2016-04-28 DIAGNOSIS — Z0000 Encounter for general adult medical examination without abnormal findings: Secondary | ICD-10-CM | POA: Diagnosis not present

## 2016-04-28 DIAGNOSIS — Z124 Encounter for screening for malignant neoplasm of cervix: Secondary | ICD-10-CM

## 2016-04-28 DIAGNOSIS — Z01419 Encounter for gynecological examination (general) (routine) without abnormal findings: Secondary | ICD-10-CM | POA: Diagnosis not present

## 2016-04-28 NOTE — Progress Notes (Signed)
   CLINIC ENCOUNTER NOTE  History:  35 y.o. G1P1 here today for wellness visit. Reports she had increased frequency and mild lower abdominal pain last week. Overall improving  She is thinking about the LNG-IUD, she is currently on OCPs but forgets to take sometimes.   She denies any abnormal vaginal discharge, bleeding, pelvic pain or other concerns.   Past Medical History:  Diagnosis Date  . Anxiety   . Vertigo 2013    History reviewed. No pertinent surgical history.  The following portions of the patient's history were reviewed and updated as appropriate: allergies, current medications, past family history, past medical history, past social history, past surgical history and problem list.   Health Maintenance:  Normal pap and negative HRHPV on 04/28/2015 (prior was NIL in 06/21/2012).  Normal mammogram -NA  Review of Systems:  Pertinent items noted in HPI and remainder of comprehensive ROS otherwise negative.   Objective:  Physical Exam BP 127/90 (BP Location: Left Arm, Patient Position: Sitting, Cuff Size: Normal)   Pulse 93   Resp 20   Ht 5\' 3"  (1.6 m)   Wt 158 lb (71.7 kg)   LMP 04/11/2016   BMI 27.99 kg/m  CONSTITUTIONAL: Well-developed, well-nourished female in no acute distress.  HENT:  Normocephalic, atraumatic. External right and left ear normal. Oropharynx is clear and moist EYES: Conjunctivae and EOM are normal. Pupils are equal, round, and reactive to light. No scleral icterus.  NECK: Normal range of motion, supple, no masses BREAST: Normal appearing nipples with no retraction or discharge. Normal CBE SKIN: Skin is warm and dry. No rash noted. Not diaphoretic. No erythema. No pallor. NEUROLGIC: Alert and oriented to person, place, and time. Normal reflexes, muscle tone coordination. No cranial nerve deficit noted. PSYCHIATRIC: Normal mood and affect. Normal behavior. Normal judgment and thought content. CARDIOVASCULAR: Normal heart rate noted RESPIRATORY:  Effort and breath sounds normal, no problems with respiration noted ABDOMEN: Soft, no distention noted.   PELVIC: Normal appearing external genitalia; normal appearing vaginal mucosa and cervix.  No abnormal discharge noted.  Normal uterine size, no other palpable masses, no uterine or adnexal tenderness. MUSCULOSKELETAL: Normal range of motion. No edema noted.  Labs and Imaging No results found.  Assessment & Plan:  1. Encounter for well woman exam with routine gynecological exam - No need for pap today, next in 2021 - Reviewed IUD risks and benefits - patient will schedule IUD insertion when ready/if she decides - Discussed self care and preventative screenings, next pap in [phone]  2. Dysuria - resolving, sx started about 1.5 weeks ago. Reports she drank cranberry juice and sx are improving, almost resolved -Discussed returning for UA if still present in 2-3 days.   Routine preventative health maintenance measures emphasized. Please refer to After Visit Summary for other counseling recommendations.   Return IUD placement . .Marland Kitchen

## 2016-04-28 NOTE — Patient Instructions (Addendum)
Intrauterine Device Information Introduction An intrauterine device (IUD) is a medical device that gets inserted into the uterus to prevent pregnancy. It is a small, T-shaped device that has one or two nylon strings hanging down from it. The strings hang out of the lower part of the uterus (cervix) to allow for future IUD removal. There are two types of IUDs available:  Copper IUD. This type of IUD has copper wire wrapped around it. A copper IUD may last up to 10 years.  Hormone IUD. This type of IUD is made of plastic and contains the hormone progestin (synthetic progesterone). A hormone IUD may last 3 to 5 years. IUDs are inserted through the vagina and placed into the uterus with a minor medical procedure. How does the IUD work? Copper in the copper IUD prevents pregnancy by making the uterus and fallopian tubes produce a fluid that kills sperm. Synthetic progesterone in hormonal IUD prevents pregnancy by:  Thickening cervical mucus to prevent sperm from entering the uterus.  Thinning the uterine lining to prevent implantation of a fertilized egg.  Weakening or killing sperm that get into the uterus. What are the advantages of an IUD?  IUDs are highly effective, reversible, long-acting, and low-maintenance.  There are no estrogen-related side effects.  An IUD can be used when breastfeeding.  IUDs are not associated with weight gain.  Advantages of the copper IUD are that:  It works immediately after insertion.  It does not interfere with your body's natural hormones.  It can be used for 10 years.  Advantages of the hormone IUD are that:  If it is inserted within 7 days of your period starting, it works immediately after insertion. If the hormone IUD is inserted at any other time in your cycle, you will need to use a backup method of birth control for 7 days after insertion.  It can make menstrual periods lighter.  It can decrease menstrual cramping.  It can be used for  3 or 5 years. What are the disadvantages of an IUD?  The hormone IUD may cause irregular menstrual bleeding for a period of time after insertion.  The copper IUD can make your menstrual flow heavier and more painful.  You may experience some pain during insertion, and cramping and vaginal bleeding after insertion. How is the IUD removed? Is the IUD right for me?  Your health care provider will make sure you are a good candidate for an IUD and will discuss side effects with you. This information is not intended to replace advice given to you by your health care provider. Make sure you discuss any questions you have with your health care provider. Document Released: 03/22/2004 Document Revised: 09/24/2015 Document Reviewed: 10/07/2012  2017 Elsevier Preventive Care 18-39 Years, Female Preventive care refers to lifestyle choices and visits with your health care provider that can promote health and wellness. What does preventive care include?  A yearly physical exam. This is also called an annual well check.  Dental exams once or twice a year.  Routine eye exams. Ask your health care provider how often you should have your eyes checked.  Personal lifestyle choices, including:  Daily care of your teeth and gums.  Regular physical activity.  Eating a healthy diet.  Avoiding tobacco and drug use.  Limiting alcohol use.  Practicing safe sex.  Taking vitamin and mineral supplements as recommended by your health care provider. What happens during an annual well check? The services and screenings done by your health  care provider during your annual well check will depend on your age, overall health, lifestyle risk factors, and family history of disease. Counseling  Your health care provider may ask you questions about your:  Alcohol use.  Tobacco use.  Drug use.  Emotional well-being.  Home and relationship well-being.  Sexual activity.  Eating habits.  Work and work  Statistician.  Method of birth control.  Menstrual cycle.  Pregnancy history. Screening  You may have the following tests or measurements:  Height, weight, and BMI.  Diabetes screening. This is done by checking your blood sugar (glucose) after you have not eaten for a while (fasting).  Blood pressure.  Lipid and cholesterol levels. These may be checked every 5 years starting at age 34.  Skin check.  Hepatitis C blood test.  Hepatitis B blood test.  Sexually transmitted disease (STD) testing.  BRCA-related cancer screening. This may be done if you have a family history of breast, ovarian, tubal, or peritoneal cancers.  Pelvic exam and Pap test. This may be done every 3 years starting at age 40. Starting at age 56, this may be done every 5 years if you have a Pap test in combination with an HPV test. Discuss your test results, treatment options, and if necessary, the need for more tests with your health care provider. Vaccines  Your health care provider may recommend certain vaccines, such as:  Influenza vaccine. This is recommended every year.  Tetanus, diphtheria, and acellular pertussis (Tdap, Td) vaccine. You may need a Td booster every 10 years.  Varicella vaccine. You may need this if you have not been vaccinated.  HPV vaccine. If you are 72 or younger, you may need three doses over 6 months.  Measles, mumps, and rubella (MMR) vaccine. You may need at least one dose of MMR. You may also need a second dose.  Pneumococcal 13-valent conjugate (PCV13) vaccine. You may need this if you have certain conditions and were not previously vaccinated.  Pneumococcal polysaccharide (PPSV23) vaccine. You may need one or two doses if you smoke cigarettes or if you have certain conditions.  Meningococcal vaccine. One dose is recommended if you are age 4-21 years and a first-year college student living in a residence hall, or if you have one of several medical conditions. You may  also need additional booster doses.  Hepatitis A vaccine. You may need this if you have certain conditions or if you travel or work in places where you may be exposed to hepatitis A.  Hepatitis B vaccine. You may need this if you have certain conditions or if you travel or work in places where you may be exposed to hepatitis B.  Haemophilus influenzae type b (Hib) vaccine. You may need this if you have certain risk factors. Talk to your health care provider about which screenings and vaccines you need and how often you need them. This information is not intended to replace advice given to you by your health care provider. Make sure you discuss any questions you have with your health care provider. Document Released: 06/14/2001 Document Revised: 01/06/2016 Document Reviewed: 02/17/2015 Elsevier Interactive Patient Education  2017 [employer].

## 2016-06-22 ENCOUNTER — Encounter: Payer: Self-pay | Admitting: *Deleted

## 2016-06-22 ENCOUNTER — Encounter: Payer: Self-pay | Admitting: Obstetrics and Gynecology

## 2016-06-22 ENCOUNTER — Ambulatory Visit: Payer: BLUE CROSS/BLUE SHIELD | Admitting: Obstetrics and Gynecology

## 2016-06-22 ENCOUNTER — Ambulatory Visit (INDEPENDENT_AMBULATORY_CARE_PROVIDER_SITE_OTHER): Payer: BLUE CROSS/BLUE SHIELD | Admitting: Obstetrics and Gynecology

## 2016-06-22 VITALS — BP 127/78 | HR 109 | Resp 18 | Ht 62.0 in | Wt 159.0 lb

## 2016-06-22 DIAGNOSIS — Z30430 Encounter for insertion of intrauterine contraceptive device: Secondary | ICD-10-CM | POA: Diagnosis not present

## 2016-06-22 DIAGNOSIS — Z3202 Encounter for pregnancy test, result negative: Secondary | ICD-10-CM | POA: Diagnosis not present

## 2016-06-22 LAB — POCT URINE PREGNANCY: Preg Test, Ur: NEGATIVE

## 2016-06-22 MED ORDER — PARAGARD INTRAUTERINE COPPER IU IUD
INTRAUTERINE_SYSTEM | Freq: Once | INTRAUTERINE | Status: AC
  Administered 2016-06-22: 14:00:00 via INTRAUTERINE

## 2016-06-22 MED ORDER — PARAGARD INTRAUTERINE COPPER IU IUD: INTRAUTERINE_SYSTEM | Freq: Once | INTRAUTERINE | Status: DC

## 2016-06-22 NOTE — Procedures (Signed)
Intrauterine Device (IUD) Paragard Insertion Procedure Note  Patient states (she's on OCPs currently) that her periods are light and not painful. D/w her risk of heavier and more crampy periods Paragard, and she still desires it.   After a recent pap (results: 04/2015/date: NILM and HPV neg) was confirmed, written consent was obtained; her urine pregnancy test was: negative The patient understands the risks of IUD placement, which include but are not limited to: bleeding, infection, uterine perforation, risk of expulsion, risk of failure < 1%, increased risk of ectopic pregnancy in the event of failure.   Prior to the procedure being performed, the patient (or guardian) was asked to state their full name, date of birth, and the type of procedure being performed. A bimanual exam showed the uterus to be midposition.  Next, the cervix and vagina were cleaned with an antiseptic solution, and the cervix was grasped with a tenaculum.  The uterus was sounded to 7.5- 8 cm after using os finders to dilate the internal os.  Using sterile gloves, the Paragard was loaded into the tube and the rod placed from the other end. The ParaGard was placed without difficulty in the usual fashion.  The strings were cut to 3-4 cm.  The tenaculum was removed and cervix was found to be hemostatic with use of silver nitrate  No complications, patient tolerated the procedure well.  Patient told to continue OCPs x 7 days and then can d/c.   Cornelia Copaharlie Pearley Baranek, Jr MD Attending [facility](Faculty Practice)

## 2016-07-13 ENCOUNTER — Ambulatory Visit: Payer: BLUE CROSS/BLUE SHIELD | Admitting: Obstetrics and Gynecology

## 2016-07-19 ENCOUNTER — Ambulatory Visit (INDEPENDENT_AMBULATORY_CARE_PROVIDER_SITE_OTHER): Payer: BLUE CROSS/BLUE SHIELD | Admitting: Family Medicine

## 2016-07-19 ENCOUNTER — Encounter: Payer: Self-pay | Admitting: Family Medicine

## 2016-07-19 VITALS — BP 122/83 | HR 83 | Ht 63.0 in | Wt 166.0 lb

## 2016-07-19 DIAGNOSIS — Z30431 Encounter for routine checking of intrauterine contraceptive device: Secondary | ICD-10-CM

## 2016-07-19 NOTE — Progress Notes (Signed)
Pt here for follow up IUD insert. She c/o increased vaginal discharge that she describes as clear in color and continues to have abd cramping.

## 2016-07-19 NOTE — Patient Instructions (Signed)

## 2016-07-19 NOTE — Progress Notes (Signed)
   Subjective:    Patient ID: Stephanie Taylor is a 36 y.o. female presenting with Follow-up  on 07/19/2016  HPI: Notes that she has some cramping. Notes some increase in vaginal discharge.  Review of Systems  Constitutional: Negative for chills and fever.  Respiratory: Negative for shortness of breath.   Cardiovascular: Negative for chest pain.  Gastrointestinal: Negative for abdominal pain, nausea and vomiting.  Genitourinary: Negative for dysuria.  Skin: Negative for rash.      Objective:    BP 122/83   Pulse 83   Ht 5\' 3"  (1.6 m)   Wt 166 lb (75.3 kg)   BMI 29.41 kg/m  Physical Exam  Constitutional: She is oriented to person, place, and time. She appears well-developed and well-nourished. No distress.  HENT:  Head: Normocephalic and atraumatic.  Eyes: No scleral icterus.  Neck: Neck supple.  Cardiovascular: Normal rate.   Pulmonary/Chest: Effort normal.  Abdominal: Soft.  Genitourinary:  Genitourinary Comments: BUS normal, vagina is pink and rugated, cervix is parous without lesion, IUD strings noted, uterus is small and anteverted, no adnexal mass or tenderness.   Neurological: She is alert and oriented to person, place, and time.  Skin: Skin is warm and dry.  Psychiatric: She has a normal mood and affect.        Assessment & Plan:  IUD check up - in place-normal cramping and bleeding profile reviewed with patient. Suspect discharge and other symptoms related to no longer taking OC's.   Total face-to-face time with patient: 10 minutes. Over 50% of encounter was spent on counseling and coordination of care.  Reva Boresanya S Ozzie Remmers 07/19/2016 8:51 AM

## 2016-07-26 ENCOUNTER — Telehealth: Payer: Self-pay | Admitting: *Deleted

## 2016-07-26 NOTE — Telephone Encounter (Signed)
-----   Message from Lindell SparHeather L Bacon, [place] sent at 07/25/2016  4:07 PM EDT ----- Regarding: pt had question about bloating Contact: [phone] Would like a nurse to call her back, has a few questions about her bloating after a procedure that she has received.

## 2016-07-26 NOTE — Telephone Encounter (Signed)
Pt had the IUD placed in February, states she has been experiencing bloating.  Informed pt that is not a common side effect but it could occur as her body is adjusting to the new device.  Instructed to call back in a few months if symptoms persist or get worse.

## 2016-08-02 ENCOUNTER — Telehealth: Payer: Self-pay | Admitting: *Deleted

## 2016-08-02 NOTE — Telephone Encounter (Signed)
Received fax from [facility] requesting refill on Loestrin, pt no longer using Loestrin due to getting the Paragard.

## 2016-08-11 ENCOUNTER — Telehealth: Payer: Self-pay | Admitting: *Deleted

## 2016-08-11 DIAGNOSIS — Z3041 Encounter for surveillance of contraceptive pills: Secondary | ICD-10-CM

## 2016-08-11 NOTE — Telephone Encounter (Signed)
Received VM from [facility] requesting a refill on the LoEstrin OCPs.  Pt is currently using the Paragard for contraception. Will not refill unless pt calls and request a refill.

## 2017-02-06 ENCOUNTER — Telehealth: Payer: Self-pay

## 2017-02-06 NOTE — Telephone Encounter (Signed)
-----   Message from Lindell Spar, [place] sent at 02/06/2017 11:35 AM EDT ----- Regarding: IUD questions Contact: [phone] Please call patient, has question about her IUD

## 2017-02-06 NOTE — Telephone Encounter (Signed)
Spoke with patient regarding IUD and not being able to feel the strings. I have advised patient to come in for appointment so that the provider can take a look.

## 2017-02-14 ENCOUNTER — Other Ambulatory Visit: Payer: Self-pay

## 2019-01-03 ENCOUNTER — Encounter: Payer: Self-pay | Admitting: Obstetrics and Gynecology

## 2019-01-03 ENCOUNTER — Ambulatory Visit (INDEPENDENT_AMBULATORY_CARE_PROVIDER_SITE_OTHER): Payer: BC Managed Care – PPO | Admitting: Obstetrics and Gynecology

## 2019-01-03 ENCOUNTER — Other Ambulatory Visit: Payer: Self-pay

## 2019-01-03 VITALS — BP 120/84 | HR 94 | Ht 62.0 in | Wt 162.6 lb

## 2019-01-03 DIAGNOSIS — Z3202 Encounter for pregnancy test, result negative: Secondary | ICD-10-CM

## 2019-01-03 DIAGNOSIS — Z113 Encounter for screening for infections with a predominantly sexual mode of transmission: Secondary | ICD-10-CM | POA: Diagnosis not present

## 2019-01-03 DIAGNOSIS — N898 Other specified noninflammatory disorders of vagina: Secondary | ICD-10-CM | POA: Diagnosis not present

## 2019-01-03 DIAGNOSIS — Z975 Presence of (intrauterine) contraceptive device: Secondary | ICD-10-CM

## 2019-01-03 DIAGNOSIS — Z1151 Encounter for screening for human papillomavirus (HPV): Secondary | ICD-10-CM | POA: Diagnosis not present

## 2019-01-03 DIAGNOSIS — R102 Pelvic and perineal pain unspecified side: Secondary | ICD-10-CM

## 2019-01-03 DIAGNOSIS — Z124 Encounter for screening for malignant neoplasm of cervix: Secondary | ICD-10-CM

## 2019-01-03 LAB — POCT URINALYSIS DIPSTICK

## 2019-01-03 LAB — POCT URINE PREGNANCY: Preg Test, Ur: NEGATIVE

## 2019-01-03 NOTE — Progress Notes (Signed)
Patient states pelvic pain for 3-4 months lasting a day or two.  Notices an increase in discharge

## 2019-01-03 NOTE — Progress Notes (Signed)
Obstetrics and Gynecology Visit Return Patient Evaluation  Appointment Date: 01/03/2019  Primary Care Provider: No primary care provider on file.  OBGYN Clinic: [facility]  Chief Complaint: chronic pelvic pain  History of Present Illness:  Mariem Skolnick is a 38 y.o. P1 (LMP: mid august) with above CC. PMHx BMI 30.  Patient states that for past few months she's had lower abdominal cramping and discomfort. She has it a few times a week, not every day, and it can be short or last several minutes. The cramping does feel like period cramps but doesn't line up to when she is supposed to be near a future period.   Periods last about 5-7 days and she describes them as heavy since Paragard was placed in   Review of Systems:  as noted in the History of Present Illness.  Patient Active Problem List   Diagnosis Date Noted  . Preventative health care 04/28/2016    Medications:  Evora Schechter had no medications administered during this visit. Current Outpatient Medications  Medication Sig Dispense Refill  . DULoxetine (CYMBALTA) 30 MG capsule Take 30 mg by mouth daily.    Marland Kitchen PARAGARD INTRAUTERINE COPPER IU by Intrauterine route.    . phentermine 15 MG capsule Take 15 mg by mouth every morning.     No current facility-administered medications for this visit.     Allergies: is allergic to norgestimate-eth estradiol.  Physical Exam:  BP 120/84   Pulse 94   Ht 5\' 2"  (1.575 m)   Wt 162 lb 9.6 oz (73.8 kg)   LMP 12/16/2018 (Approximate)   BMI 29.74 kg/m  Body mass index is 29.74 kg/m. General appearance: Well nourished, well developed female in no acute distress.  Abdomen: diffusely non tender to palpation, non distended, and no masses, hernias Neuro/Psych:  Normal mood and affect.    Pelvic exam:  EGBUS, vaginal vault and cervix: within normal limits. Normal white vag discharge. Two white IUD strings approximately 3cm tucked into fornices. Scant  spotting after pap  Labs: u/a with mild to mod leuks and trace blood. UPT neg  Radiology: none  Assessment: pt stable   Plan:  1. Vaginal discharge Normal. Pap updated - Cytology - PAP( [place]) - Cervicovaginal ancillary only( [place])  2. Pelvic pain Will send for urine culture given u/a results. Early cycle transvag u/s ordered. Pt states she has satisfied fertility. D/w her re: BTL and can talk more after u/s results back - Cytology - PAP( [place]) - Cervicovaginal ancillary only( [place])   RTC: will follow up with pt after u/s results  Durene Romans MD Attending [facility])

## 2019-01-03 NOTE — Addendum Note (Signed)
Addended by: Crosby Oyster on: 01/03/2019 02:16 PM   Modules accepted: Orders

## 2019-01-04 LAB — CYTOLOGY - PAP
Chlamydia: NEGATIVE
Diagnosis: NEGATIVE
HPV: NOT DETECTED
Neisseria Gonorrhea: NEGATIVE
Trichomonas: NEGATIVE

## 2019-01-04 LAB — CERVICOVAGINAL ANCILLARY ONLY
Bacterial vaginitis: NEGATIVE
Candida vaginitis: NEGATIVE

## 2019-01-05 LAB — URINE CULTURE

## 2019-01-15 ENCOUNTER — Ambulatory Visit: Admission: RE | Admit: 2019-01-15 | Payer: BC Managed Care – PPO | Source: Ambulatory Visit

## 2019-05-19 ENCOUNTER — Emergency Department: Payer: BC Managed Care – PPO

## 2019-05-19 ENCOUNTER — Encounter: Payer: Self-pay | Admitting: Intensive Care

## 2019-05-19 ENCOUNTER — Other Ambulatory Visit: Payer: Self-pay

## 2019-05-19 ENCOUNTER — Emergency Department
Admission: EM | Admit: 2019-05-19 | Discharge: 2019-05-20 | Disposition: A | Discharge status: Home / Self Care | Payer: BC Managed Care – PPO | Attending: Emergency Medicine | Admitting: Emergency Medicine

## 2019-05-19 DIAGNOSIS — R0602 Shortness of breath: Secondary | ICD-10-CM | POA: Diagnosis not present

## 2019-05-19 DIAGNOSIS — R55 Syncope and collapse: Secondary | ICD-10-CM

## 2019-05-19 DIAGNOSIS — R0789 Other chest pain: Secondary | ICD-10-CM | POA: Diagnosis not present

## 2019-05-19 DIAGNOSIS — R42 Dizziness and giddiness: Secondary | ICD-10-CM | POA: Diagnosis not present

## 2019-05-19 LAB — BASIC METABOLIC PANEL
Anion gap: 10 (ref 5–15)
BUN: 13 mg/dL (ref 6–20)
CO2: 24 mmol/L (ref 22–32)
Calcium: 9.7 mg/dL (ref 8.9–10.3)
Chloride: 105 mmol/L (ref 98–111)
Creatinine, Ser: 0.69 mg/dL (ref 0.44–1.00)
GFR calc Af Amer: >60 mL/min (ref >60)
GFR calc non Af Amer: >60 mL/min (ref >60)
Glucose, Bld: 88 mg/dL (ref 70–99)
Potassium: 4 mmol/L (ref 3.5–5.1)
Sodium: 139 mmol/L (ref 135–145)

## 2019-05-19 LAB — FIBRIN DERIVATIVES D-DIMER (ARMC ONLY): Fibrin derivatives D-dimer (ARMC): 230.59 ng/mL (FEU) (ref 0.00–499.00)

## 2019-05-19 LAB — CBC
HCT: 38.8 % (ref 36.0–46.0)
Hemoglobin: 13.1 g/dL (ref 12.0–15.0)
MCH: 28.2 pg (ref 26.0–34.0)
MCHC: 33.8 g/dL (ref 30.0–36.0)
MCV: 83.6 fL (ref 80.0–100.0)
Platelets: 324 10*3/uL (ref 150–400)
RBC: 4.64 MIL/uL (ref 3.87–5.11)
RDW: 12.1 % (ref 11.5–15.5)
WBC: 11.6 10*3/uL — ABNORMAL HIGH (ref 4.0–10.5)
nRBC: 0 % (ref 0.0–0.2)

## 2019-05-19 LAB — TROPONIN I (HIGH SENSITIVITY)
Troponin I (High Sensitivity): <2 ng/L (ref <18)
Troponin I (High Sensitivity): <2 ng/L (ref <18)

## 2019-05-19 NOTE — Discharge Instructions (Addendum)
As we discussed, I suspect your symptoms are partly due to multiple factors increasing your stress and/or heart rate. It's possible you had a transient arrhythmia, or heart rhythm change.   Call your doctor in the AM. I would recommend discussing a possible outpatient Holter/cardiac monitor.  For now: - Try to cut back on caffeine intake. - Drink plenty of fluids - I would recommend stopping Phentermine if able, as this can cause stress, high HR, and arrhythmia - Avoid Sudafed or decongestants - Try to minimize stress and increase sleep to 8 hours nightly

## 2019-05-19 NOTE — ED Provider Notes (Signed)
[facility] Emergency Department Provider Note  ____________________________________________   First MD Initiated Contact with Patient 05/19/19 2036     (approximate)  I have reviewed the triage vital signs and the nursing notes.   HISTORY  Chief Complaint Chest Pain    HPI Stephanie Taylor is a 39 y.o. female  Here with chest pain, SOB. Pt reports that earlier today, she was at work when she began to experience acute onset of initially substernal then left-sided chest pressure. She felt like it was a chest tightness but also aching feeling, with associated mild flushed feeling, anxiety, and lightheadedness. She went home and her sx persisted, but she also began to feel very warm, lightheaded, like she was going to pass out. She walked to her husband and put her head on his chest, felt like she briefly passed out. She laid down and now feels somewhat better, though she does endorse some ongoing intermittent CP. She reports a h/o similar pain/episodes w/ anxiety in past, but does not believe she was particularly anxious or stressed when sx happened today. No OCP use (has IUD), no h/o DVT/PE. No h/o coronary disease. No family h/o arrhythmia. No personal h/o arrhythmia or CAD. She does note she has been under more stress recently. She also had coffee and a red bull prior to the episode. Denise overt palpitations however and her smart watch did not record significant increase in HR. No specific alleviating factors.       Past Medical History:  Diagnosis Date  . Anxiety   . Vertigo 2013    Patient Active Problem List   Diagnosis Date Noted  . Pelvic pain 01/03/2019  . IUD (intrauterine device) in place 01/03/2019  . Preventative health care 04/28/2016    History reviewed. No pertinent surgical history.  Prior to Admission medications   Medication Sig Start Date End Date Taking? Authorizing Provider  DULoxetine (CYMBALTA) 30 MG capsule Take 30 mg by mouth  daily.    [provider]  PARAGARD INTRAUTERINE COPPER IU by Intrauterine route.    [provider]  phentermine 15 MG capsule Take 15 mg by mouth every morning.    [provider]    Allergies Norgestimate-eth estradiol  Family History  Problem Relation Age of Onset  . Diabetes Maternal Grandfather   . Cancer Maternal Grandfather        skin  . Heart attack Paternal Grandfather     Social History Social History   Tobacco Use  . Smoking status: Never Smoker  . Smokeless tobacco: Never Used  Substance Use Topics  . Alcohol use: Yes    Alcohol/week: 14.0 standard drinks    Types: 14 Glasses of wine per week  . Drug use: No    Review of Systems  Review of Systems  Constitutional: Positive for fatigue. Negative for fever.  HENT: Negative for congestion and sore throat.   Eyes: Negative for visual disturbance.  Respiratory: Positive for chest tightness and shortness of breath. Negative for cough.   Cardiovascular: Positive for chest pain.  Gastrointestinal: Negative for abdominal pain, diarrhea, nausea and vomiting.  Genitourinary: Negative for flank pain.  Musculoskeletal: Negative for back pain and neck pain.  Skin: Negative for rash and wound.  Neurological: Positive for syncope and light-headedness. Negative for weakness.  All other systems reviewed and are negative.    ____________________________________________  PHYSICAL EXAM:      VITAL SIGNS: ED Triage Vitals  Enc Vitals Group  BP 05/19/19 1840 128/90     Pulse Rate 05/19/19 1840 (!) 108     Resp 05/19/19 1840 16     Temp 05/19/19 1840 98.2 F (36.8 C)     Temp Source 05/19/19 1840 Oral     SpO2 05/19/19 1840 100 %     Weight 05/19/19 1841 160 lb (72.6 kg)     Height 05/19/19 1841 5\' 2"  (1.575 m)     Head Circumference --      Peak Flow --      Pain Score 05/19/19 1841 3     Pain Loc --      Pain Edu? --      Excl. in GC? --      Physical Exam Vitals and nursing  note reviewed.  Constitutional:      General: She is not in acute distress.    Appearance: She is well-developed.  HENT:     Head: Normocephalic and atraumatic.  Eyes:     Conjunctiva/sclera: Conjunctivae normal.  Cardiovascular:     Rate and Rhythm: Normal rate and regular rhythm.     Heart sounds: Normal heart sounds. No murmur. No friction rub.  Pulmonary:     Effort: Pulmonary effort is normal. No respiratory distress.     Breath sounds: Normal breath sounds. No wheezing or rales.  Abdominal:     General: There is no distension.     Palpations: Abdomen is soft.     Tenderness: There is no abdominal tenderness.  Musculoskeletal:     Cervical back: Neck supple.  Skin:    General: Skin is warm.     Capillary Refill: Capillary refill takes less than 2 seconds.  Neurological:     Mental Status: She is alert and oriented to person, place, and time.     Motor: No abnormal muscle tone.       ____________________________________________   LABS (all labs ordered are listed, but only abnormal results are displayed)  Labs Reviewed  CBC - Abnormal; Notable for the following components:      Result Value   WBC 11.6 (*)    All other components within normal limits  BASIC METABOLIC PANEL  FIBRIN DERIVATIVES D-DIMER ([facility] ONLY)  POC URINE PREG, ED  TROPONIN I (HIGH SENSITIVITY)  TROPONIN I (HIGH SENSITIVITY)    ____________________________________________  EKG: Normal sinus rhythm, VR 98. PR 138, QRS 72, QTc 446. No ST elevations or depressions. ________________________________________  RADIOLOGY All imaging, including plain films, CT scans, and ultrasounds, independently reviewed by me, and interpretations confirmed via formal radiology reads.  ED MD interpretation:   CXR: clear  Official radiology report(s): DG Chest 2 View  Result Date: 05/19/2019 CLINICAL DATA:  Central and left-sided chest pain. EXAM: CHEST - 2 VIEW COMPARISON:  None. FINDINGS: The heart size and  mediastinal contours are within normal limits. Both lungs are clear. The visualized skeletal structures are unremarkable. IMPRESSION: No active cardiopulmonary disease. Electronically Signed   By: 05/21/2019 M.D.   On: 05/19/2019 19:05    ____________________________________________  PROCEDURES   Procedure(s) performed (including Critical Care):  Procedures  ____________________________________________  INITIAL IMPRESSION / MDM / ASSESSMENT AND PLAN / ED COURSE  As part of my medical decision making, I reviewed the following data within the electronic MEDICAL RECORD NUMBER Nursing notes reviewed and incorporated, Old chart reviewed, Notes from prior ED visits, and [place] Controlled Substance Database       *Stephanie Taylor was evaluated in Emergency Department on 05/20/2019  for the symptoms described in the history of present illness. She was evaluated in the context of the global COVID-19 pandemic, which necessitated consideration that the patient might be at risk for infection with the SARS-CoV-2 virus that causes COVID-19. Institutional protocols and algorithms that pertain to the evaluation of patients at risk for COVID-19 are in a state of rapid change based on information released by regulatory bodies including the CDC and federal and state organizations. These policies and algorithms were followed during the patient's care in the ED.  Some ED evaluations and interventions may be delayed as a result of limited staffing during the pandemic.*     Medical Decision Making:  Very pleasant 39 yo F here with atypical CP, lightheadedness, and questionable syncope. EKG non-ischemic and trop neg x 2 - do not suspect ACS, and she is a low-risk HEART score. She has no evidence of long QT, WPW, HOCM, or other arrhythmogenic abnormality on EKG and no ectopy on tele, though SVT is a consideration. Suspect this could be anxiety-related, versus possible transient SVT. She drank a large amount of  caffeine prior to episode, was under increased stress, and also is on phentermine, all of which could be exacerbating underlying anxiety or PSVT. Otherwise, lytes acceptable. No anemia. D-Dimer negative and I do not suspect PE. Given neg trop and reassuring labs/vitals, will have her try to cut back on caffeine, ideally stop phentermine, and f/u with her PCP re: possible increase of her Cymbalta, as well as consideration of outpt Holter for possible arrhythmia. Return precautions given.  ____________________________________________  FINAL CLINICAL IMPRESSION(S) / ED DIAGNOSES  Final diagnoses:  Atypical chest pain  Near syncope     MEDICATIONS GIVEN DURING THIS VISIT:  Medications - No data to display   ED Discharge Orders    None       Note:  This document was prepared using Dragon voice recognition software and may include unintentional dictation errors.   Shaune Pollack, MD 05/20/19 [id number]

## 2019-05-19 NOTE — ED Triage Notes (Signed)
C/o central/left sided dull/achy chest pains with lightheadedness that started around 11am. A&O x4 in triage

## 2019-08-08 ENCOUNTER — Ambulatory Visit: Payer: BC Managed Care – PPO | Attending: Internal Medicine

## 2019-08-08 DIAGNOSIS — Z23 Encounter for immunization: Secondary | ICD-10-CM

## 2019-08-08 NOTE — Progress Notes (Signed)
   Covid-19 Vaccination Clinic  Name:  Tannis Burstein    MRN: [id number] DOB: 20-Aug-1980  08/08/2019  Ms. Melfi was observed post Covid-19 immunization for 15 minutes without incident. She was provided with Vaccine Information Sheet and instruction to access the V-Safe system.   Ms. Hawbaker was instructed to call 911 with any severe reactions post vaccine: Marland Kitchen Difficulty breathing  . Swelling of face and throat  . A fast heartbeat  . A bad rash all over body  . Dizziness and weakness   Immunizations Administered    Name Date Dose VIS Date Route   Pfizer COVID-19 Vaccine 08/08/2019 12:08 PM 0.3 mL 04/12/2019 Intramuscular   Manufacturer: [employer], [employer]   Lot: [id number]   NDC: [address]-5

## 2019-09-03 ENCOUNTER — Ambulatory Visit: Payer: BC Managed Care – PPO | Attending: Internal Medicine

## 2019-09-03 DIAGNOSIS — Z23 Encounter for immunization: Secondary | ICD-10-CM

## 2019-09-03 NOTE — Progress Notes (Signed)
   Covid-19 Vaccination Clinic  Name:  Tisha Cline    MRN: [id number] DOB: 12/18/1980  09/03/2019  Ms. Baisch was observed post Covid-19 immunization for 15 minutes without incident. She was provided with Vaccine Information Sheet and instruction to access the V-Safe system.   Ms. Joubert was instructed to call 911 with any severe reactions post vaccine: Marland Kitchen Difficulty breathing  . Swelling of face and throat  . A fast heartbeat  . A bad rash all over body  . Dizziness and weakness   Immunizations Administered    Name Date Dose VIS Date Route   Pfizer COVID-19 Vaccine 09/03/2019 12:04 PM 0.3 mL 06/26/2018 Intramuscular   Manufacturer: [employer], [employer]   Lot: [id number]   NDC: [address]-7

## 2020-08-31 IMAGING — CR DG CHEST 2V
1 series · 2 of 2 positions shown · non-contrast
Comparison: None.

CLINICAL DATA: Central and left-sided chest pain.

EXAM:
CHEST - 2 VIEW

[Series 1: dg chest 2 view · 0.14mm/px · 2 of 2 slices shown]
[im 1/2]
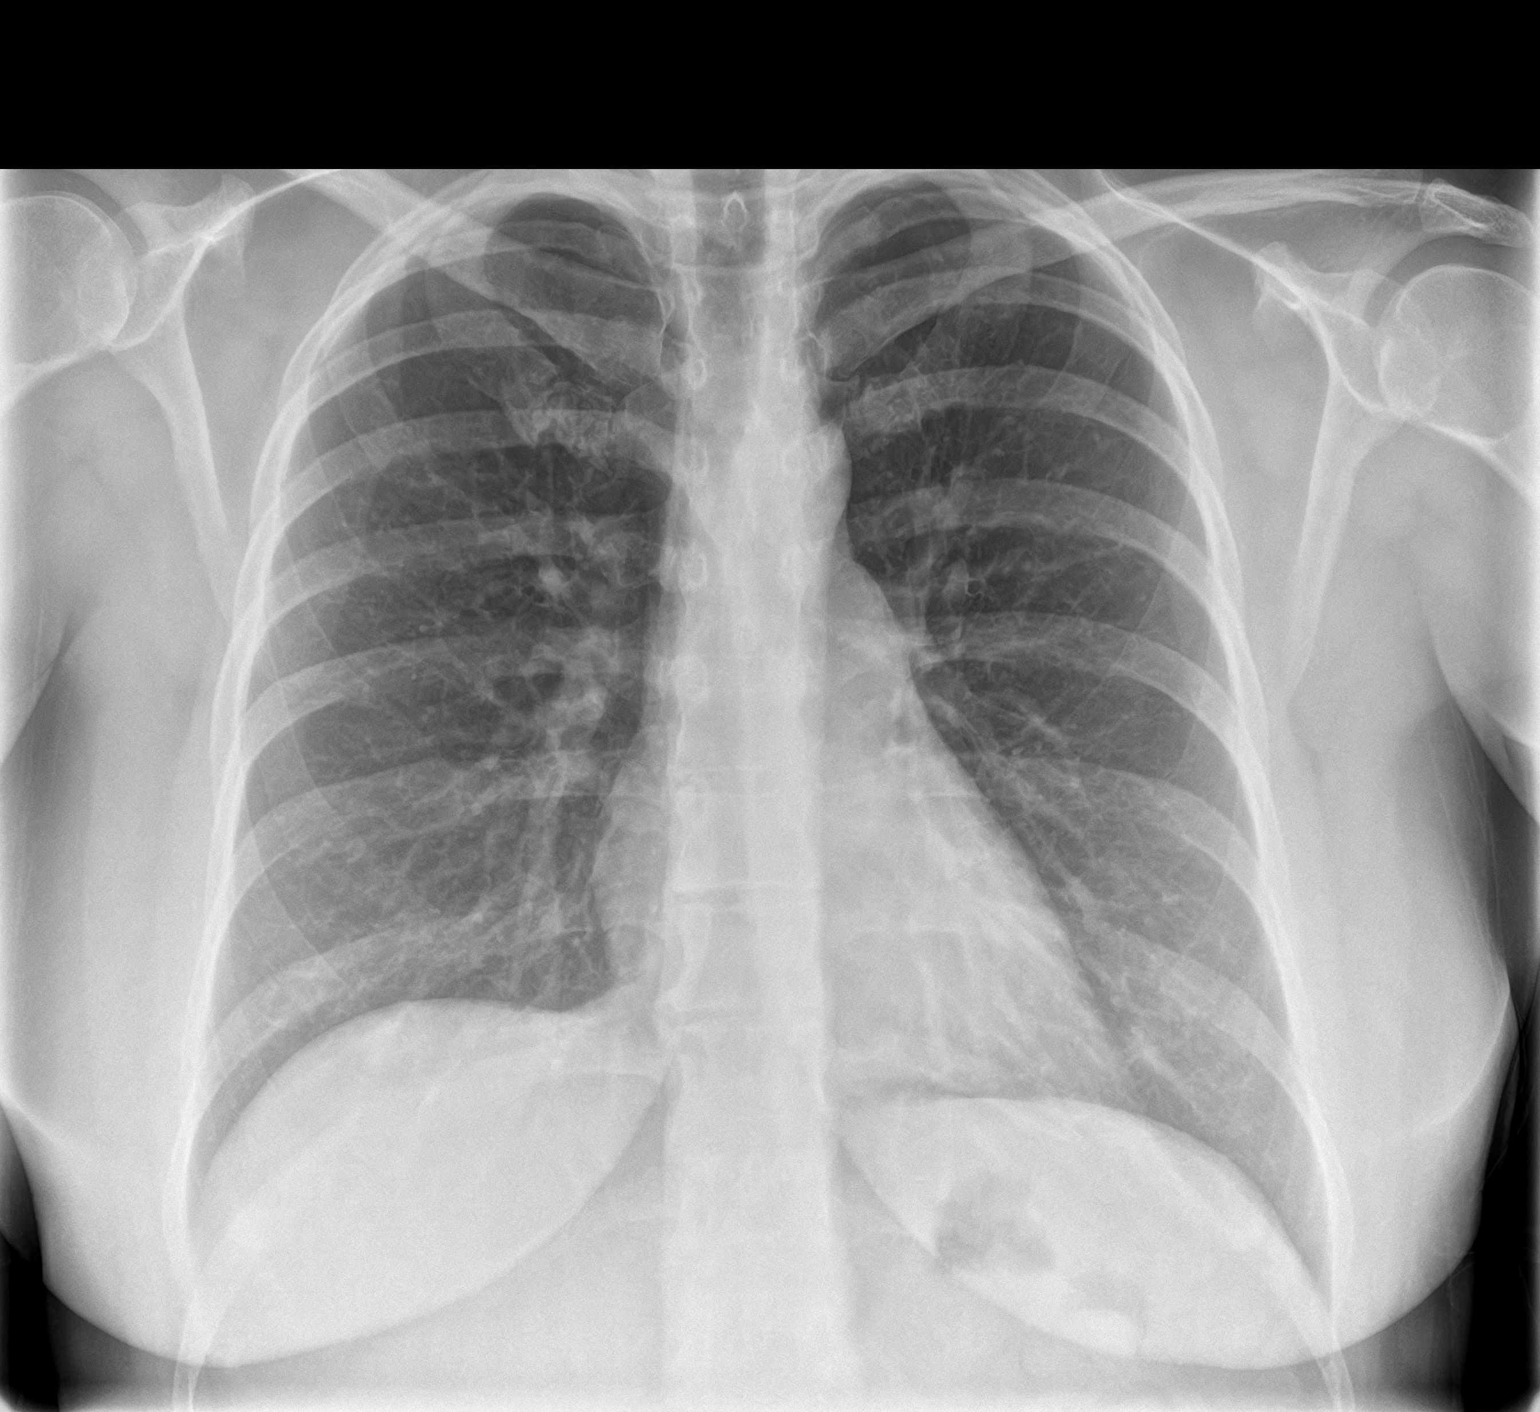
[im 2/2]
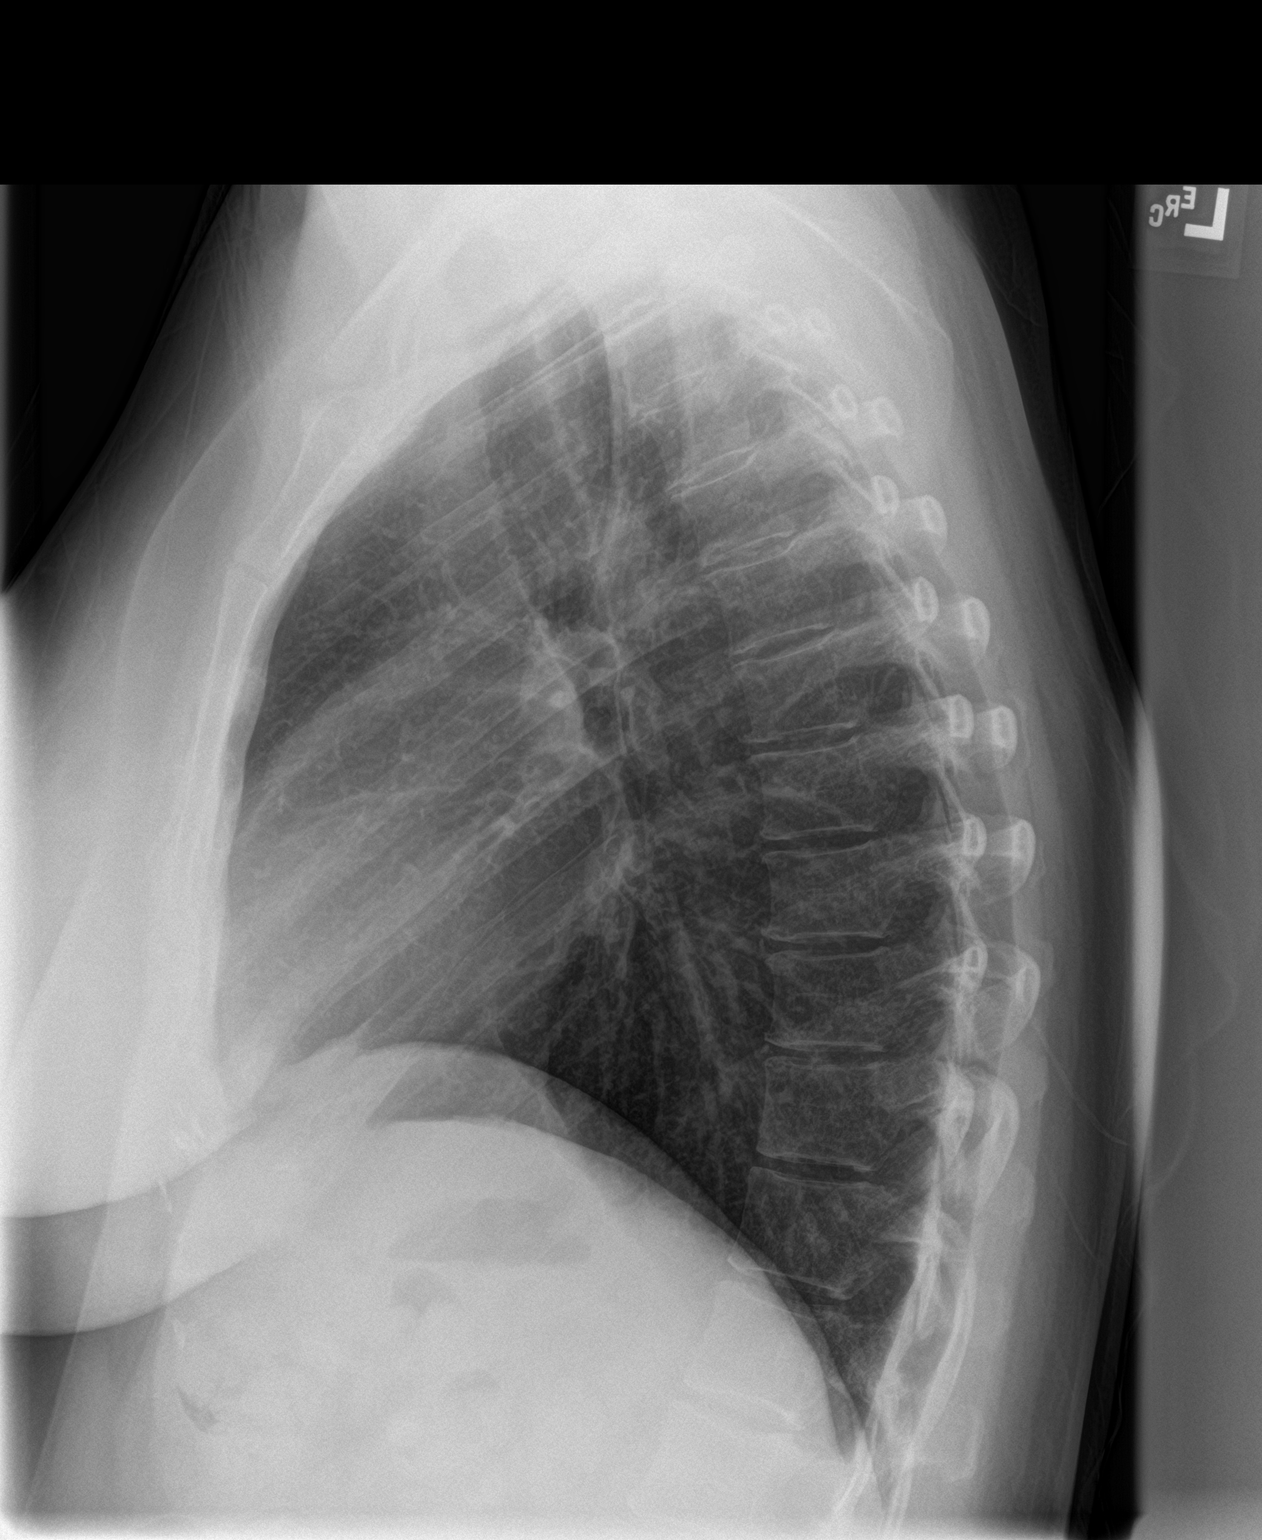

[2 of 2 positions shown; findings below may reference images not displayed]

FINDINGS: The heart size and mediastinal contours are within normal limits.
Both lungs are clear. The visualized skeletal structures are
unremarkable.
IMPRESSION: No active cardiopulmonary disease.

## 2021-07-01 ENCOUNTER — Ambulatory Visit: Payer: BC Managed Care – PPO | Admitting: Obstetrics and Gynecology

## 2021-07-15 ENCOUNTER — Other Ambulatory Visit: Payer: Self-pay

## 2021-07-15 ENCOUNTER — Encounter: Payer: Self-pay | Admitting: Obstetrics and Gynecology

## 2021-07-15 ENCOUNTER — Ambulatory Visit: Payer: BC Managed Care – PPO | Admitting: Obstetrics and Gynecology

## 2021-07-15 VITALS — BP 127/81 | HR 76 | Ht 62.0 in | Wt 162.0 lb

## 2021-07-15 DIAGNOSIS — N926 Irregular menstruation, unspecified: Secondary | ICD-10-CM

## 2021-07-15 NOTE — Progress Notes (Signed)
?  Obstetrics and Gynecology Visit ?Return Patient Evaluation ? ?Appointment Date: 07/15/2021 ? ?Primary Care Provider: Lyndon Code ? ?OBGYN Clinic: [facility] ? ?Chief Complaint: change in periods ? ?History of Present Illness:  Stephanie Taylor is a 41 y.o. with above CC ? ?Patient has had different periods for the past six months; prior to his her periods were qmonth, regular, 7-10d, somewhat heavy and painful.  For past six months, her periods have had shorter interval in between periods, still about the same length and ?not has heavy or painful; she's also noted change in her PMS s/s. LMPs 1/31, 1/10, 12/10. No menopausal s/s ? ?Pap and hpv neg 2020 and she has had her paragard since 06/2016 ? ?Review of Systems: as noted in the History of Present Illness. ? ?Patient Active Problem List  ? Diagnosis Date Noted  ? Pelvic pain 01/03/2019  ? IUD (intrauterine device) in place 01/03/2019  ? Preventative health care 04/28/2016  ? ?Medications:  ?Annjanette Wertenberger had no medications administered during this visit. ?Current Outpatient Medications  ?Medication Sig Dispense Refill  ? DULoxetine (CYMBALTA) 30 MG capsule Take 30 mg by mouth daily.    ? PARAGARD INTRAUTERINE COPPER IU by Intrauterine route.    ? phentermine 15 MG capsule Take 15 mg by mouth every morning. (Patient not taking: Reported on 07/15/2021)    ? ?No current facility-administered medications for this visit.  ? ? ?Allergies: is allergic to norgestimate-eth estradiol. ? ?Physical Exam:  ?BP 127/81   Pulse 76   Ht 5\' 2"  (1.575 m)   Wt 162 lb (73.5 kg)   BMI 29.63 kg/m?  Body mass index is 29.63 kg/m?. ?General appearance: Well nourished, well developed female in no acute distress.  ?Abdomen: diffusely non tender to palpation, non distended, and no masses, hernias ?Neuro/Psych:  Normal mood and affect.   ? ?Pelvic exam:  ?EGBUS: wnl ?Vaginal vault: wnl ?Cervix:  IUD strings wnl, 3-4cm in length. Mucus looks  pre-ovulatory ?Bimanual: negative ? ?Labs: UPT negative ? ?Assessment: pt stable ? ?Plan: ?Unlikely due to IUD due to no hormone, pain in between periods, exam normal. I told her that 21-42d for period intervals is considered normal, even though this most recent one is on the longer side. I told her if no period by April to take do a UPT (at home or with May) and let us know either way. ? ?If patient desires more regular periods, pt to let us know ? ?RTC: PRN ? ?[place] MD ?Attending ?[facility] [employer]) ? ? ?

## 2021-07-15 NOTE — Progress Notes (Signed)
Last couple of months -periods are more irregular and pelvic pain not during a cycle  ?

## 2022-04-17 ENCOUNTER — Other Ambulatory Visit: Payer: Self-pay

## 2022-04-17 ENCOUNTER — Emergency Department
Admission: EM | Admit: 2022-04-17 | Discharge: 2022-04-17 | Discharge status: Against Medical Advice | Payer: BC Managed Care – PPO | Attending: Emergency Medicine | Admitting: Emergency Medicine

## 2022-04-17 DIAGNOSIS — R110 Nausea: Secondary | ICD-10-CM | POA: Diagnosis not present

## 2022-04-17 DIAGNOSIS — Z5321 Procedure and treatment not carried out due to patient leaving prior to being seen by health care provider: Secondary | ICD-10-CM | POA: Diagnosis not present

## 2022-04-17 DIAGNOSIS — R109 Unspecified abdominal pain: Secondary | ICD-10-CM | POA: Diagnosis present

## 2022-04-17 LAB — CBC
HCT: 36.5 % (ref 36.0–46.0)
Hemoglobin: 11.9 g/dL — ABNORMAL LOW (ref 12.0–15.0)
MCH: 27.6 pg (ref 26.0–34.0)
MCHC: 32.6 g/dL (ref 30.0–36.0)
MCV: 84.7 fL (ref 80.0–100.0)
Platelets: 244 10*3/uL (ref 150–400)
RBC: 4.31 MIL/uL (ref 3.87–5.11)
RDW: 12.3 % (ref 11.5–15.5)
WBC: 7.7 10*3/uL (ref 4.0–10.5)
nRBC: 0 % (ref 0.0–0.2)

## 2022-04-17 LAB — COMPREHENSIVE METABOLIC PANEL
ALT: 23 U/L (ref 0–44)
AST: 22 U/L (ref 15–41)
Albumin: 3.8 g/dL (ref 3.5–5.0)
Alkaline Phosphatase: 70 U/L (ref 38–126)
Anion gap: 7 (ref 5–15)
BUN: 12 mg/dL (ref 6–20)
CO2: 24 mmol/L (ref 22–32)
Calcium: 8.7 mg/dL — ABNORMAL LOW (ref 8.9–10.3)
Chloride: 106 mmol/L (ref 98–111)
Creatinine, Ser: 0.64 mg/dL (ref 0.44–1.00)
GFR, Estimated: >60 mL/min (ref >60)
Glucose, Bld: 102 mg/dL — ABNORMAL HIGH (ref 70–99)
Potassium: 3.8 mmol/L (ref 3.5–5.1)
Sodium: 137 mmol/L (ref 135–145)
Total Bilirubin: 0.4 mg/dL (ref 0.3–1.2)
Total Protein: 7 g/dL (ref 6.5–8.1)

## 2022-04-17 LAB — LIPASE, BLOOD: Lipase: 36 U/L (ref 11–51)

## 2022-04-17 NOTE — ED Triage Notes (Signed)
Pt called from [facility] to treatment room, no response 

## 2022-04-17 NOTE — ED Triage Notes (Signed)
Pt states abdominal pain that started last night. Pt states she thought it was something she ate, but it has been getting worse. Pt states she has some nausea, but no vomiting or diarrhea. Pt states pain decreases when walking.

## 2022-06-07 ENCOUNTER — Encounter: Payer: Self-pay | Admitting: Obstetrics and Gynecology

## 2022-06-07 ENCOUNTER — Other Ambulatory Visit (HOSPITAL_COMMUNITY)
Admission: RE | Admit: 2022-06-07 | Discharge: 2022-06-07 | Disposition: A | Discharge status: Home / Self Care | Payer: BC Managed Care – PPO | Source: Ambulatory Visit | Attending: Obstetrics and Gynecology | Admitting: Obstetrics and Gynecology

## 2022-06-07 ENCOUNTER — Ambulatory Visit (INDEPENDENT_AMBULATORY_CARE_PROVIDER_SITE_OTHER): Payer: BC Managed Care – PPO | Admitting: Obstetrics and Gynecology

## 2022-06-07 VITALS — BP 124/80 | HR 75 | Wt 166.4 lb

## 2022-06-07 DIAGNOSIS — Z01419 Encounter for gynecological examination (general) (routine) without abnormal findings: Secondary | ICD-10-CM

## 2022-06-07 DIAGNOSIS — Z124 Encounter for screening for malignant neoplasm of cervix: Secondary | ICD-10-CM | POA: Diagnosis present

## 2022-06-07 NOTE — Progress Notes (Signed)
CC: Annual pap  Last pap:2020   Declines any STI screenings

## 2022-06-07 NOTE — Progress Notes (Signed)
Obstetrics and Gynecology Annual Patient Evaluation  Appointment Date: 06/07/2022  OBGYN Clinic: Center for [facility]  Primary Care Provider: Lavera Taylor  Referring Provider: Lavera Guise, MD  Chief Complaint:  Chief Complaint  Patient presents with   Gynecologic Exam    History of Present Illness: Stephanie Taylor is a 42 y.o. G1P1, seen for the above chief complaint.  No issues or problems today  Review of Systems: A comprehensive review of systems was negative.    Patient Active Problem List   Diagnosis Date Noted   IUD (intrauterine device) in place 01/03/2019   Preventative health care 04/28/2016    Past Medical History:  Past Medical History:  Diagnosis Date   Anxiety    Vertigo 2013    Past Surgical History:  No past surgical history on file.  Past Obstetrical History:  OB History  Gravida Para Term Preterm AB Living  1 1       1   SAB IAB Ectopic Multiple Live Births          1    # Outcome Date GA Lbr Len/2nd Weight Sex Delivery Anes PTL Lv  1 Para 01/30/99    F Vag-Spont   LIV    Past Gynecological History: As per HPI. Periods: qmonth, regular, not heavy or painful History of Pap Smear(s): Yes.   Last pap 2020, which was negative She is currently using  Paragard (placed 2018)  for contraception.   Social History:  Social History   Socioeconomic History   Marital status: Married    Spouse name: Not on file   Number of children: Not on file   Years of education: Not on file   Highest education level: Not on file  Occupational History   Not on file  Tobacco Use   Smoking status: Never   Smokeless tobacco: Never  Vaping Use   Vaping Use: Never used  Substance and Sexual Activity   Alcohol use: Yes    Alcohol/week: 14.0 standard drinks of alcohol    Types: 14 Glasses of wine per week   Drug use: No   Sexual activity: Yes    Partners: Male    Birth control/protection: I.U.D.  Other Topics Concern   Not on file   Social History Narrative   Not on file   Social Determinants of Health   Financial Resource Strain: Not on file  Food Insecurity: Not on file  Transportation Needs: Not on file  Physical Activity: Not on file  Stress: Not on file  Social Connections: Not on file  Intimate Partner Violence: Not on file    Family History:  Family History  Problem Relation Age of Onset   Diabetes Maternal Grandfather    Cancer Maternal Grandfather        skin   Heart attack Paternal Grandfather      Medications Stephanie Taylor had no medications administered during this visit. Current Outpatient Medications  Medication Sig Dispense Refill   DULoxetine (CYMBALTA) 30 MG capsule Take 30 mg by mouth daily.     PARAGARD INTRAUTERINE COPPER IU by Intrauterine route.     NURTEC 75 MG TBDP Take 1 tablet by mouth as needed. (Patient not taking: Reported on 06/07/2022)     phentermine 15 MG capsule Take 15 mg by mouth every morning. (Patient not taking: Reported on 07/15/2021)     No current facility-administered medications for this visit.    Allergies Norgestimate-eth estradiol   Physical Exam:  BP 124/80  Pulse 75   Wt 166 lb 6.4 oz (75.5 kg)   BMI 30.43 kg/m  Body mass index is 30.43 kg/m. General appearance: Well nourished, well developed female in no acute distress.  Neck:  Supple, normal appearance, and no thyromegaly  Cardiovascular: normal s1 and s2.  No murmurs, rubs or gallops. Respiratory:  Clear to auscultation bilateral. Normal respiratory effort Abdomen: positive bowel sounds and no masses, hernias; diffusely non tender to palpation, non distended Neuro/Psych:  Normal mood and affect.  Skin:  Warm and dry.  Lymphatic:  No inguinal lymphadenopathy.   Pelvic exam: is not limited by body habitus EGBUS: within normal limits Vagina: within normal limits Cervix: normal appearing cervix without tenderness, discharge or lesions. IUD strings 3cm from os Uterus:  nonenlarged and  non tender Adnexa:  normal adnexa and no mass, fullness, tenderness Rectovaginal: deferred  Laboratory: none  Radiology: none  Assessment: patient doing well  Plan:  1. Well woman exam with routine gynecological exam Patient amenable to screening mammo - Cytology - PAP( [place])  2. Cervical cancer screening - Cytology - PAP( [place])   RTC PRN  Stephanie Romans MD Attending Center for [facility])

## 2022-06-08 ENCOUNTER — Encounter: Payer: Self-pay | Admitting: Family

## 2022-06-08 ENCOUNTER — Ambulatory Visit: Payer: BC Managed Care – PPO | Admitting: Family

## 2022-06-08 VITALS — BP 120/80 | HR 97 | Ht 63.0 in | Wt 167.4 lb

## 2022-06-08 DIAGNOSIS — Z6830 Body mass index (BMI) 30.0-30.9, adult: Secondary | ICD-10-CM

## 2022-06-08 DIAGNOSIS — R635 Abnormal weight gain: Secondary | ICD-10-CM

## 2022-06-08 DIAGNOSIS — E559 Vitamin D deficiency, unspecified: Secondary | ICD-10-CM

## 2022-06-08 DIAGNOSIS — G43119 Migraine with aura, intractable, without status migrainosus: Secondary | ICD-10-CM

## 2022-06-08 DIAGNOSIS — E782 Mixed hyperlipidemia: Secondary | ICD-10-CM | POA: Diagnosis not present

## 2022-06-08 DIAGNOSIS — E538 Deficiency of other specified B group vitamins: Secondary | ICD-10-CM

## 2022-06-08 DIAGNOSIS — E6609 Other obesity due to excess calories: Secondary | ICD-10-CM

## 2022-06-08 DIAGNOSIS — R7303 Prediabetes: Secondary | ICD-10-CM

## 2022-06-08 DIAGNOSIS — I10 Essential (primary) hypertension: Secondary | ICD-10-CM

## 2022-06-08 DIAGNOSIS — G43909 Migraine, unspecified, not intractable, without status migrainosus: Secondary | ICD-10-CM | POA: Insufficient documentation

## 2022-06-08 DIAGNOSIS — E663 Overweight: Secondary | ICD-10-CM | POA: Insufficient documentation

## 2022-06-08 MED ORDER — PHENTERMINE HCL 37.5 MG PO TABS: 37.5000 mg | ORAL_TABLET | Freq: Every day | ORAL | 0 refills | Status: DC

## 2022-06-08 NOTE — Patient Instructions (Signed)

## 2022-06-08 NOTE — Assessment & Plan Note (Signed)
Restarting Phentermine for pt.  She has been reminded of the requirements for follow up and results in the meantime, as well as the follow up schedule.

## 2022-06-08 NOTE — Assessment & Plan Note (Signed)
Checking blood work today.  Will also send refills for her Nurtec.  May consider additional medications if labs are normal and headaches are not well controlled.   Will also set up ENT referral for the ear fullness feeling she is describing with her headaches.

## 2022-06-08 NOTE — Progress Notes (Signed)
Still having headaches.     Established Patient Office Visit  Subjective   Patient ID: Stephanie Taylor, female    DOB: 13-May-1980  Age: 42 y.o. MRN: [id number]  Chief Complaint  Patient presents with   Follow-up    Neck Pain  This is a recurrent problem. The current episode started more than 1 month ago. The problem occurs every several days. The problem has been unchanged. The pain is associated with nothing. The pain is present in the left side. The quality of the pain is described as aching and cramping. Nothing aggravates the symptoms. Associated symptoms include headaches and photophobia. She has tried NSAIDs, heat, home exercises, acetaminophen and ice for the symptoms. The treatment provided mild relief.  Migraine  This is a chronic problem. The current episode started more than 1 year ago. The problem occurs intermittently. The problem has been waxing and waning. The pain is located in the Left unilateral region. The pain radiates to the left neck. The pain quality is similar to prior headaches. The quality of the pain is described as aching and throbbing. Associated symptoms include neck pain, phonophobia and photophobia. Associated symptoms comments: Ear fullness - hears bubbles popping. . The symptoms are aggravated by bright light, caffeine withdrawal, hunger and fatigue.     Past Medical History:  Diagnosis Date   Anxiety    Vertigo 2013    Social History   Socioeconomic History   Marital status: Married    Spouse name: Not on file   Number of children: Not on file   Years of education: Not on file   Highest education level: Not on file  Occupational History   Not on file  Tobacco Use   Smoking status: Never   Smokeless tobacco: Never  Vaping Use   Vaping Use: Never used  Substance and Sexual Activity   Alcohol use: Yes    Alcohol/week: 14.0 standard drinks of alcohol    Types: 14 Glasses of wine per week   Drug use: No   Sexual activity: Yes    Partners:  Male    Birth control/protection: I.U.D.  Other Topics Concern   Not on file  Social History Narrative   Not on file   Social Determinants of Health   Financial Resource Strain: Not on file  Food Insecurity: Not on file  Transportation Needs: Not on file  Physical Activity: Not on file  Stress: Not on file  Social Connections: Not on file  Intimate Partner Violence: Not on file    Family History  Problem Relation Age of Onset   Diabetes Maternal Grandfather    Cancer Maternal Grandfather        skin   Heart attack Paternal Grandfather     Allergies  Allergen Reactions   Norgestimate-Eth Estradiol Nausea Only    Review of Systems  Eyes:  Positive for photophobia.  Musculoskeletal:  Positive for neck pain.  Neurological:  Positive for headaches.       Objective:     BP 120/80 (BP Location: Right Arm, Patient Position: Sitting, Cuff Size: Normal)   Pulse 97   Ht 5\' 3"  (1.6 m)   Wt 167 lb 6.4 oz (75.9 kg)   SpO2 97%   BMI 29.65 kg/m   Vitals:   06/08/22 1359  BP: 120/80  Pulse: 97  Height: 5\' 3"  (1.6 m)  Weight: 167 lb 6.4 oz (75.9 kg)  SpO2: 97%  BMI (Calculated): 29.66    Physical Exam Vitals and nursing  note reviewed.  Constitutional:      Appearance: Normal appearance. She is normal weight.  HENT:     Head: Normocephalic.     Right Ear: Tympanic membrane, ear canal and external ear normal.     Left Ear: Tympanic membrane, ear canal and external ear normal.     Nose: Nose normal.  Eyes:     Pupils: Pupils are equal, round, and reactive to light.  Neck:     Vascular: No carotid bruit.  Cardiovascular:     Rate and Rhythm: Normal rate.  Pulmonary:     Effort: Pulmonary effort is normal.  Musculoskeletal:     Cervical back: Normal range of motion. No rigidity or tenderness.  Lymphadenopathy:     Cervical: No cervical adenopathy.  Neurological:     General: No focal deficit present.     Mental Status: She is alert and oriented to person,  place, and time.      No results found for any visits on 06/08/22.  Recent Results (from the past 2160 hour(s))  Lipase, blood     Status: None   Collection Time: 04/17/22  9:28 AM  Result Value Ref Range   Lipase 36 11 - 51 U/L    Comment: Performed at [facility], [address] [place], [place] [address]  Comprehensive metabolic panel     Status: Abnormal   Collection Time: 04/17/22  9:28 AM  Result Value Ref Range   Sodium 137 135 - 145 mmol/L   Potassium 3.8 3.5 - 5.1 mmol/L   Chloride 106 98 - 111 mmol/L   CO2 24 22 - 32 mmol/L   Glucose, Bld 102 (H) 70 - 99 mg/dL    Comment: Glucose reference range applies only to samples taken after fasting for at least 8 hours.   BUN 12 6 - 20 mg/dL   Creatinine, Ser 0.64 0.44 - 1.00 mg/dL   Calcium 8.7 (L) 8.9 - 10.3 mg/dL   Total Protein 7.0 6.5 - 8.1 g/dL   Albumin 3.8 3.5 - 5.0 g/dL   AST 22 15 - 41 U/L   ALT 23 0 - 44 U/L   Alkaline Phosphatase 70 38 - 126 U/L   Total Bilirubin 0.4 0.3 - 1.2 mg/dL   GFR, Estimated >60 >60 mL/min    Comment: (NOTE) Calculated using the CKD-EPI Creatinine Equation (2021)    Anion gap 7 5 - 15    Comment: Performed at [facility], [address] [place], [place] [address]  CBC     Status: Abnormal   Collection Time: 04/17/22  9:28 AM  Result Value Ref Range   WBC 7.7 4.0 - 10.5 K/uL   RBC 4.31 3.87 - 5.11 MIL/uL   Hemoglobin 11.9 (L) 12.0 - 15.0 g/dL   HCT 36.5 36.0 - 46.0 %   MCV 84.7 80.0 - 100.0 fL   MCH 27.6 26.0 - 34.0 pg   MCHC 32.6 30.0 - 36.0 g/dL   RDW 12.3 11.5 - 15.5 %   Platelets 244 150 - 400 K/uL   nRBC 0.0 0.0 - 0.2 %    Comment: Performed at [facility], [address], [place], [place] [address]      Assessment & Plan:   Problem List Items Addressed This Visit     Overweight    Restarting Phentermine for pt.  She has been reminded of the requirements for follow up and results in the meantime, as well as the follow up  schedule.         Migraines    Checking blood work today.  Will also send refills for her Nurtec.  May consider additional medications if labs are normal and headaches are not well controlled.   Will also set up ENT referral for the ear fullness feeling she is describing with her headaches.        Other Visit Diagnoses     Mixed hyperlipidemia    -  Primary   Relevant Orders   Lipid panel   Essential hypertension, benign       Relevant Orders   CBC With Differential   CMP14+EGFR   B12 deficiency due to diet       Relevant Orders   Vitamin B12   Vitamin D deficiency, unspecified       Relevant Orders   VITAMIN D 25 Hydroxy (Vit-D Deficiency, Fractures)   Class 1 obesity due to excess calories with serious comorbidity and body mass index (BMI) of 30.0 to 30.9 in adult       Relevant Medications   phentermine (ADIPEX-P) 37.5 MG tablet   Prediabetes       Relevant Orders   Hemoglobin A1c   Abnormal weight gain       Relevant Orders   TSH   Overweight with body mass index (BMI) 25.0-29.9           No follow-ups on file.   Total time spent: 30 minutes  [place], FNP

## 2022-06-09 LAB — CMP14+EGFR
ALT: 15 IU/L (ref 0–32)
AST: 18 IU/L (ref 0–40)
Albumin/Globulin Ratio: 1.7 (ref 1.2–2.2)
Albumin: 4.2 g/dL (ref 3.9–4.9)
Alkaline Phosphatase: 78 IU/L (ref 44–121)
BUN/Creatinine Ratio: 22 (ref 9–23)
BUN: 19 mg/dL (ref 6–24)
Bilirubin Total: <0.2 mg/dL (ref 0.0–1.2)
CO2: 20 mmol/L (ref 20–29)
Calcium: 9 mg/dL (ref 8.7–10.2)
Chloride: 103 mmol/L (ref 96–106)
Creatinine, Ser: 0.87 mg/dL (ref 0.57–1.00)
Globulin, Total: 2.5 g/dL (ref 1.5–4.5)
Glucose: 78 mg/dL (ref 70–99)
Potassium: 4.1 mmol/L (ref 3.5–5.2)
Sodium: 138 mmol/L (ref 134–144)
Total Protein: 6.7 g/dL (ref 6.0–8.5)
eGFR: 85 mL/min/{1.73_m2} (ref >59)

## 2022-06-09 LAB — LIPID PANEL
Chol/HDL Ratio: 3.6 ratio (ref 0.0–4.4)
Cholesterol, Total: 153 mg/dL (ref 100–199)
HDL: 42 mg/dL (ref >39)
LDL Chol Calc (NIH): 90 mg/dL (ref 0–99)
Triglycerides: 113 mg/dL (ref 0–149)
VLDL Cholesterol Cal: 21 mg/dL (ref 5–40)

## 2022-06-09 LAB — CBC WITH DIFFERENTIAL
Basophils Absolute: 0.1 10*3/uL (ref 0.0–0.2)
Basos: 1 %
EOS (ABSOLUTE): 0.1 10*3/uL (ref 0.0–0.4)
Eos: 1 %
Hematocrit: 35.9 % (ref 34.0–46.6)
Hemoglobin: 11.7 g/dL (ref 11.1–15.9)
Immature Grans (Abs): 0 10*3/uL (ref 0.0–0.1)
Immature Granulocytes: 0 %
Lymphocytes Absolute: 2.4 10*3/uL (ref 0.7–3.1)
Lymphs: 24 %
MCH: 27.6 pg (ref 26.6–33.0)
MCHC: 32.6 g/dL (ref 31.5–35.7)
MCV: 85 fL (ref 79–97)
Monocytes Absolute: 0.5 10*3/uL (ref 0.1–0.9)
Monocytes: 5 %
Neutrophils Absolute: 6.9 10*3/uL (ref 1.4–7.0)
Neutrophils: 69 %
RBC: 4.24 x10E6/uL (ref 3.77–5.28)
RDW: 13.4 % (ref 11.7–15.4)
WBC: 10 10*3/uL (ref 3.4–10.8)

## 2022-06-09 LAB — HEMOGLOBIN A1C
Est. average glucose Bld gHb Est-mCnc: 111 mg/dL
Hgb A1c MFr Bld: 5.5 % (ref 4.8–5.6)

## 2022-06-09 LAB — TSH: TSH: 2.3 u[IU]/mL (ref 0.450–4.500)

## 2022-06-09 LAB — VITAMIN D 25 HYDROXY (VIT D DEFICIENCY, FRACTURES): Vit D, 25-Hydroxy: 20 ng/mL — ABNORMAL LOW (ref 30.0–100.0)

## 2022-06-13 LAB — CYTOLOGY - PAP
Comment: NEGATIVE
Diagnosis: NEGATIVE
High risk HPV: NEGATIVE

## 2022-06-16 ENCOUNTER — Other Ambulatory Visit: Payer: Self-pay | Admitting: Family

## 2022-07-07 ENCOUNTER — Ambulatory Visit: Payer: BC Managed Care – PPO

## 2022-07-08 ENCOUNTER — Other Ambulatory Visit: Payer: Self-pay | Admitting: *Deleted

## 2022-07-08 DIAGNOSIS — Z1231 Encounter for screening mammogram for malignant neoplasm of breast: Secondary | ICD-10-CM

## 2022-07-14 ENCOUNTER — Ambulatory Visit: Payer: BC Managed Care – PPO | Admitting: Family

## 2022-07-20 ENCOUNTER — Encounter: Payer: Self-pay | Admitting: Family

## 2022-07-20 ENCOUNTER — Ambulatory Visit: Payer: BC Managed Care – PPO | Admitting: Family

## 2022-07-20 VITALS — BP 120/64 | HR 89 | Ht 63.0 in | Wt 154.6 lb

## 2022-07-20 DIAGNOSIS — E6609 Other obesity due to excess calories: Secondary | ICD-10-CM | POA: Diagnosis not present

## 2022-07-20 DIAGNOSIS — J301 Allergic rhinitis due to pollen: Secondary | ICD-10-CM | POA: Diagnosis not present

## 2022-07-20 DIAGNOSIS — Z6830 Body mass index (BMI) 30.0-30.9, adult: Secondary | ICD-10-CM

## 2022-07-20 MED ORDER — PHENTERMINE HCL 37.5 MG PO TABS: 37.5000 mg | ORAL_TABLET | Freq: Every day | ORAL | 0 refills | Status: DC

## 2022-07-20 MED ORDER — FEXOFENADINE HCL 180 MG PO TABS: 180.0000 mg | ORAL_TABLET | Freq: Every day | ORAL | 1 refills | Status: AC

## 2022-07-20 MED ORDER — AZELASTINE HCL 0.1 % NA SOLN: 1.0000 | Freq: Two times a day (BID) | NASAL | 12 refills | Status: AC

## 2022-07-21 NOTE — Progress Notes (Signed)
Established Patient Office Visit  Subjective:  Patient ID: Stephanie Taylor, female    DOB: 1981/02/06  Age: 42 y.o. MRN: [id number]  Chief Complaint  Patient presents with   Follow-up    4 week follow up    Patient is here for her 1 month follow up weight check.  she has lost >4 pounds in the last month and meets the requirements for continuing phentermine therapy.  she denies any side effects.   No other concerns at this time.   Past Medical History:  Diagnosis Date   Anxiety    Vertigo 2013    No past surgical history on file.  Social History   Socioeconomic History   Marital status: Married    Spouse name: Not on file   Number of children: Not on file   Years of education: Not on file   Highest education level: Not on file  Occupational History   Not on file  Tobacco Use   Smoking status: Never   Smokeless tobacco: Never  Vaping Use   Vaping Use: Never used  Substance and Sexual Activity   Alcohol use: Yes    Alcohol/week: 14.0 standard drinks of alcohol    Types: 14 Glasses of wine per week   Drug use: No   Sexual activity: Yes    Partners: Male    Birth control/protection: I.U.D.  Other Topics Concern   Not on file  Social History Narrative   Not on file   Social Determinants of Health   Financial Resource Strain: Not on file  Food Insecurity: Not on file  Transportation Needs: Not on file  Physical Activity: Not on file  Stress: Not on file  Social Connections: Not on file  Intimate Partner Violence: Not on file    Family History  Problem Relation Age of Onset   Diabetes Maternal Grandfather    Cancer Maternal Grandfather        skin   Heart attack Paternal Grandfather     Allergies  Allergen Reactions   Norgestimate-Eth Estradiol Nausea Only    Review of Systems  Constitutional:  Positive for weight loss.  All other systems reviewed and are negative.      Objective:   BP 120/64   Pulse 89   Ht 5\' 3"  (1.6 m)   Wt 154  lb 9.6 oz (70.1 kg)   SpO2 98%   BMI 27.39 kg/m   Vitals:   07/20/22 1112  BP: 120/64  Pulse: 89  Height: 5\' 3"  (1.6 m)  Weight: 154 lb 9.6 oz (70.1 kg)  SpO2: 98%  BMI (Calculated): 27.39    Physical Exam Vitals and nursing note reviewed.  Constitutional:      Appearance: Normal appearance. She is normal weight.  HENT:     Head: Normocephalic.  Eyes:     Pupils: Pupils are equal, round, and reactive to light.  Cardiovascular:     Rate and Rhythm: Normal rate.  Pulmonary:     Effort: Pulmonary effort is normal.  Neurological:     General: No focal deficit present.     Mental Status: She is alert and oriented to person, place, and time.  Psychiatric:        Mood and Affect: Mood normal.        Behavior: Behavior normal.        Thought Content: Thought content normal.        Judgment: Judgment normal.      No results found for  any visits on 07/20/22.  Recent Results (from the past 2160 hour(s))  Cytology - PAP( [place])     Status: None   Collection Time: 06/07/22  1:14 PM  Result Value Ref Range   High risk HPV Negative    Adequacy      Satisfactory for evaluation; transformation zone component PRESENT.   Diagnosis      - Negative for intraepithelial lesion or malignancy (NILM)   Comment Normal Reference Range HPV - Negative   Lipid panel     Status: None   Collection Time: 06/08/22  2:35 PM  Result Value Ref Range   Cholesterol, Total 153 100 - 199 mg/dL   Triglycerides 113 0 - 149 mg/dL   HDL 42 >39 mg/dL   VLDL Cholesterol Cal 21 5 - 40 mg/dL   LDL Chol Calc (NIH) 90 0 - 99 mg/dL   Chol/HDL Ratio 3.6 0.0 - 4.4 ratio    Comment:                                   T. Chol/HDL Ratio                                             Men  Women                               1/2 Avg.Risk  3.4    3.3                                   Avg.Risk  5.0    4.4                                2X Avg.Risk  9.6    7.1                                3X Avg.Risk 23.4    11.0   VITAMIN D 25 Hydroxy (Vit-D Deficiency, Fractures)     Status: Abnormal   Collection Time: 06/08/22  2:35 PM  Result Value Ref Range   Vit D, 25-Hydroxy 20.0 (L) 30.0 - 100.0 ng/mL    Comment: Vitamin D deficiency has been defined by the [place] practice guideline as a level of serum 25-OH vitamin D less than 20 ng/mL (1,2). The Endocrine Society went on to further define vitamin D insufficiency as a level between 21 and 29 ng/mL (2). 1. IOM (Institute of Medicine). 2010. Dietary reference    intakes for calcium and D. [place]: The    [employer]. 2. Holick MF, Binkley [place], Bischoff-Ferrari HA, et al.    Evaluation, treatment, and prevention of vitamin D    deficiency: an Endocrine Society clinical practice    guideline. JCEM. 2011 Jul; 96(7):1911-30.   CBC With Differential     Status: None   Collection Time: 06/08/22  2:35 PM  Result Value Ref Range   WBC 10.0 3.4 - 10.8 x10E3/uL   RBC 4.24 3.77 - 5.28 x10E6/uL  Hemoglobin 11.7 11.1 - 15.9 g/dL   Hematocrit 35.9 34.0 - 46.6 %   MCV 85 79 - 97 fL   MCH 27.6 26.6 - 33.0 pg   MCHC 32.6 31.5 - 35.7 g/dL   RDW 13.4 11.7 - 15.4 %   Neutrophils 69 Not Estab. %   Lymphs 24 Not Estab. %   Monocytes 5 Not Estab. %   Eos 1 Not Estab. %   Basos 1 Not Estab. %   Neutrophils Absolute 6.9 1.4 - 7.0 x10E3/uL   Lymphocytes Absolute 2.4 0.7 - 3.1 x10E3/uL   Monocytes Absolute 0.5 0.1 - 0.9 x10E3/uL   EOS (ABSOLUTE) 0.1 0.0 - 0.4 x10E3/uL   Basophils Absolute 0.1 0.0 - 0.2 x10E3/uL   Immature Granulocytes 0 Not Estab. %   Immature Grans (Abs) 0.0 0.0 - 0.1 x10E3/uL  CMP14+EGFR     Status: None   Collection Time: 06/08/22  2:35 PM  Result Value Ref Range   Glucose 78 70 - 99 mg/dL   BUN 19 6 - 24 mg/dL   Creatinine, Ser 0.87 0.57 - 1.00 mg/dL   eGFR 85 >59 mL/min/1.73   BUN/Creatinine Ratio 22 9 - 23   Sodium 138 134 - 144 mmol/L   Potassium 4.1 3.5 - 5.2 mmol/L    Chloride 103 96 - 106 mmol/L   CO2 20 20 - 29 mmol/L   Calcium 9.0 8.7 - 10.2 mg/dL   Total Protein 6.7 6.0 - 8.5 g/dL   Albumin 4.2 3.9 - 4.9 g/dL   Globulin, Total 2.5 1.5 - 4.5 g/dL   Albumin/Globulin Ratio 1.7 1.2 - 2.2   Bilirubin Total <0.2 0.0 - 1.2 mg/dL   Alkaline Phosphatase 78 44 - 121 IU/L   AST 18 0 - 40 IU/L   ALT 15 0 - 32 IU/L  TSH     Status: None   Collection Time: 06/08/22  2:35 PM  Result Value Ref Range   TSH 2.300 0.450 - 4.500 uIU/mL  Hemoglobin A1c     Status: None   Collection Time: 06/08/22  2:35 PM  Result Value Ref Range   Hgb A1c MFr Bld 5.5 4.8 - 5.6 %    Comment:          Prediabetes: 5.7 - 6.4          Diabetes: >6.4          Glycemic control for adults with diabetes: <7.0    Est. average glucose Bld gHb Est-mCnc 111 mg/dL      Assessment & Plan:   Problem List Items Addressed This Visit   None Visit Diagnoses     Seasonal allergic rhinitis due to pollen    -  Primary   Relevant Medications   azelastine (ASTELIN) 0.1 % nasal spray   fexofenadine (ALLEGRA) 180 MG tablet   Class 1 obesity due to excess calories with serious comorbidity and body mass index (BMI) of 30.0 to 30.9 in adult       Relevant Medications   phentermine (ADIPEX-P) 37.5 MG tablet       Return in about 1 month (around 08/20/2022) for F/U.   Total time spent: 20 minutes  Mechele Claude, FNP  07/20/2022

## 2022-08-22 ENCOUNTER — Ambulatory Visit: Payer: BC Managed Care – PPO | Admitting: Family

## 2022-09-01 ENCOUNTER — Ambulatory Visit: Payer: BC Managed Care – PPO | Admitting: Family

## 2022-09-06 ENCOUNTER — Ambulatory Visit
Admission: RE | Admit: 2022-09-06 | Discharge: 2022-09-06 | Disposition: A | Discharge status: Home / Self Care | Payer: BC Managed Care – PPO | Source: Ambulatory Visit | Attending: Obstetrics and Gynecology | Admitting: Obstetrics and Gynecology

## 2022-09-06 DIAGNOSIS — Z1231 Encounter for screening mammogram for malignant neoplasm of breast: Secondary | ICD-10-CM

## 2022-09-08 ENCOUNTER — Encounter: Payer: Self-pay | Admitting: Family

## 2022-09-08 ENCOUNTER — Ambulatory Visit: Payer: BC Managed Care – PPO | Admitting: Family

## 2022-09-08 VITALS — BP 112/62 | HR 91 | Ht 63.0 in | Wt 150.0 lb

## 2022-09-08 DIAGNOSIS — E6609 Other obesity due to excess calories: Secondary | ICD-10-CM

## 2022-09-08 DIAGNOSIS — I10 Essential (primary) hypertension: Secondary | ICD-10-CM

## 2022-09-08 DIAGNOSIS — Z6830 Body mass index (BMI) 30.0-30.9, adult: Secondary | ICD-10-CM

## 2022-09-08 DIAGNOSIS — R5383 Other fatigue: Secondary | ICD-10-CM

## 2022-09-08 DIAGNOSIS — E559 Vitamin D deficiency, unspecified: Secondary | ICD-10-CM

## 2022-09-08 DIAGNOSIS — E538 Deficiency of other specified B group vitamins: Secondary | ICD-10-CM

## 2022-09-08 DIAGNOSIS — E782 Mixed hyperlipidemia: Secondary | ICD-10-CM | POA: Diagnosis not present

## 2022-09-08 DIAGNOSIS — R7303 Prediabetes: Secondary | ICD-10-CM

## 2022-09-08 MED ORDER — PHENTERMINE HCL 37.5 MG PO TABS: 37.5000 mg | ORAL_TABLET | Freq: Every day | ORAL | 0 refills | Status: DC

## 2022-09-08 NOTE — Progress Notes (Signed)
Established Patient Office Visit  Subjective:  Patient ID: Stephanie Taylor, female    DOB: 1981-01-04  Age: 42 y.o. MRN: [id number]  Chief Complaint  Patient presents with   Follow-up    1 month follow up    Patient is here for her 1 month follow up weight check.  she has lost >4 pounds in the last month and meets the requirements for continuing phentermine therapy.  she denies any side effects.   No other concerns at this time.      No other concerns at this time.   Past Medical History:  Diagnosis Date   Anxiety    Vertigo 2013    History reviewed. No pertinent surgical history.  Social History   Socioeconomic History   Marital status: Married    Spouse name: Not on file   Number of children: Not on file   Years of education: Not on file   Highest education level: Not on file  Occupational History   Not on file  Tobacco Use   Smoking status: Never   Smokeless tobacco: Never  Vaping Use   Vaping Use: Never used  Substance and Sexual Activity   Alcohol use: Yes    Alcohol/week: 14.0 standard drinks of alcohol    Types: 14 Glasses of wine per week   Drug use: No   Sexual activity: Yes    Partners: Male    Birth control/protection: I.U.D.  Other Topics Concern   Not on file  Social History Narrative   Not on file   Social Determinants of Health   Financial Resource Strain: Not on file  Food Insecurity: Not on file  Transportation Needs: Not on file  Physical Activity: Not on file  Stress: Not on file  Social Connections: Not on file  Intimate Partner Violence: Not on file    Family History  Problem Relation Age of Onset   Diabetes Maternal Grandfather    Cancer Maternal Grandfather        skin   Heart attack Paternal Grandfather     Allergies  Allergen Reactions   Norgestimate-Eth Estradiol Nausea Only    Review of Systems  Constitutional:  Positive for weight loss.  All other systems reviewed and are negative.      Objective:    BP 112/62   Pulse 91   Ht 5\' 3"  (1.6 m)   Wt 150 lb (68 kg)   SpO2 96%   BMI 26.57 kg/m   Vitals:   09/08/22 1306  BP: 112/62  Pulse: 91  Height: 5\' 3"  (1.6 m)  Weight: 150 lb (68 kg)  SpO2: 96%  BMI (Calculated): 26.58    Physical Exam Vitals and nursing note reviewed.  Constitutional:      Appearance: Normal appearance. She is normal weight.  HENT:     Head: Normocephalic.  Eyes:     Pupils: Pupils are equal, round, and reactive to light.  Cardiovascular:     Rate and Rhythm: Normal rate.  Pulmonary:     Effort: Pulmonary effort is normal.  Neurological:     Mental Status: She is alert.      No results found for any visits on 09/08/22.  No results found for this or any previous visit (from the past 2160 hour(s)).     Assessment & Plan:   Problem List Items Addressed This Visit       Active Problems   Class 1 obesity due to excess calories with serious comorbidity and  body mass index (BMI) of 30.0 to 30.9 in adult    The patient has med the threshold to continue phentermine therapy.  she has been reminded of the weight loss requirements to continue therapy.  she verbalized understanding and agreement with plan of care.  Continue current meds.  Will adjust as needed based on results.  The patient is asked to make an attempt to improve diet and exercise patterns to aid in medical management of this problem. Addressed importance of increasing and maintaining water intake.        Relevant Medications   phentermine (ADIPEX-P) 37.5 MG tablet   Essential hypertension, benign   Relevant Orders   CBC With Differential   CMP14+EGFR   FSH+Prog+E2+SHBG   Mixed hyperlipidemia   Relevant Orders   Lipid panel   CBC With Differential   CMP14+EGFR   FSH+Prog+E2+SHBG   Other Visit Diagnoses     B12 deficiency due to diet    -  Primary   Relevant Orders   CBC With Differential   CMP14+EGFR   Vitamin B12   FSH+Prog+E2+SHBG   Vitamin D deficiency,  unspecified       Relevant Orders   VITAMIN D 25 Hydroxy (Vit-D Deficiency, Fractures)   CBC With Differential   CMP14+EGFR   FSH+Prog+E2+SHBG   Prediabetes       Relevant Orders   CBC With Differential   CMP14+EGFR   Hemoglobin A1c   FSH+Prog+E2+SHBG   Other fatigue       Relevant Orders   CBC With Differential   CMP14+EGFR   TSH   FSH+Prog+E2+SHBG       Return in about 1 month (around 10/09/2022) for F/U.   Total time spent: 20 minutes  Miki Kins, FNP  09/08/2022

## 2022-09-08 NOTE — Assessment & Plan Note (Signed)
The patient has med the threshold to continue phentermine therapy.  she has been reminded of the weight loss requirements to continue therapy.  she verbalized understanding and agreement with plan of care.  Continue current meds.  Will adjust as needed based on results.  The patient is asked to make an attempt to improve diet and exercise patterns to aid in medical management of this problem. Addressed importance of increasing and maintaining water intake.

## 2022-09-16 LAB — VITAMIN B12

## 2022-09-16 LAB — CMP14+EGFR
ALT: 19 IU/L (ref 0–32)
AST: 19 IU/L (ref 0–40)
Albumin/Globulin Ratio: 2 (ref 1.2–2.2)
Albumin: 4.3 g/dL (ref 3.9–4.9)
Alkaline Phosphatase: 84 IU/L (ref 44–121)
BUN/Creatinine Ratio: 22 (ref 9–23)
BUN: 15 mg/dL (ref 6–24)
Bilirubin Total: <0.2 mg/dL (ref 0.0–1.2)
CO2: 23 mmol/L (ref 20–29)
Calcium: 9.3 mg/dL (ref 8.7–10.2)
Chloride: 103 mmol/L (ref 96–106)
Creatinine, Ser: 0.69 mg/dL (ref 0.57–1.00)
Globulin, Total: 2.1 g/dL (ref 1.5–4.5)
Glucose: 80 mg/dL (ref 70–99)
Potassium: 4.2 mmol/L (ref 3.5–5.2)
Sodium: 138 mmol/L (ref 134–144)
Total Protein: 6.4 g/dL (ref 6.0–8.5)
eGFR: 111 mL/min/{1.73_m2} (ref >59)

## 2022-09-16 LAB — CBC WITH DIFFERENTIAL
Basophils Absolute: 0.1 10*3/uL (ref 0.0–0.2)
Basos: 1 %
EOS (ABSOLUTE): 0.1 10*3/uL (ref 0.0–0.4)
Eos: 1 %
Hematocrit: 36.5 % (ref 34.0–46.6)
Hemoglobin: 11.9 g/dL (ref 11.1–15.9)
Immature Grans (Abs): 0 10*3/uL (ref 0.0–0.1)
Immature Granulocytes: 0 %
Lymphocytes Absolute: 1.9 10*3/uL (ref 0.7–3.1)
Lymphs: 26 %
MCH: 28 pg (ref 26.6–33.0)
MCHC: 32.6 g/dL (ref 31.5–35.7)
MCV: 86 fL (ref 79–97)
Monocytes Absolute: 0.4 10*3/uL (ref 0.1–0.9)
Monocytes: 5 %
Neutrophils Absolute: 4.8 10*3/uL (ref 1.4–7.0)
Neutrophils: 67 %
RBC: 4.25 x10E6/uL (ref 3.77–5.28)
RDW: 12.4 % (ref 11.7–15.4)
WBC: 7.2 10*3/uL (ref 3.4–10.8)

## 2022-09-16 LAB — FSH+PROG+E2+SHBG
Estradiol: 41.3 pg/mL
FSH: 28.9 m[IU]/mL
Progesterone: 0.2 ng/mL
Sex Hormone Binding: 29.7 nmol/L (ref 24.6–122.0)

## 2022-09-16 LAB — HEMOGLOBIN A1C
Est. average glucose Bld gHb Est-mCnc: 111 mg/dL
Hgb A1c MFr Bld: 5.5 % (ref 4.8–5.6)

## 2022-09-16 LAB — LIPID PANEL
Chol/HDL Ratio: 3.5 ratio (ref 0.0–4.4)
Cholesterol, Total: 161 mg/dL (ref 100–199)
HDL: 46 mg/dL (ref >39)
LDL Chol Calc (NIH): 99 mg/dL (ref 0–99)
Triglycerides: 83 mg/dL (ref 0–149)
VLDL Cholesterol Cal: 16 mg/dL (ref 5–40)

## 2022-09-16 LAB — TSH: TSH: 2.47 u[IU]/mL (ref 0.450–4.500)

## 2022-09-16 LAB — VITAMIN D 25 HYDROXY (VIT D DEFICIENCY, FRACTURES): Vit D, 25-Hydroxy: 48.2 ng/mL (ref 30.0–100.0)

## 2022-09-19 ENCOUNTER — Encounter: Payer: Self-pay | Admitting: Family

## 2022-09-25 ENCOUNTER — Encounter: Payer: Self-pay | Admitting: Family

## 2022-09-29 ENCOUNTER — Encounter: Payer: Self-pay | Admitting: Family

## 2022-10-13 ENCOUNTER — Telehealth: Payer: Self-pay | Admitting: Family

## 2022-10-13 NOTE — Telephone Encounter (Signed)
Patient called in requesting referral to ENT for left sided migraines, ear fullness and popping on left side.   [facility]

## 2022-10-18 ENCOUNTER — Ambulatory Visit: Payer: BC Managed Care – PPO | Admitting: Family

## 2022-11-07 ENCOUNTER — Ambulatory Visit: Payer: BC Managed Care – PPO | Admitting: Family

## 2022-11-08 ENCOUNTER — Other Ambulatory Visit: Payer: Self-pay | Admitting: Family

## 2022-11-08 ENCOUNTER — Ambulatory Visit: Payer: BC Managed Care – PPO | Admitting: Family

## 2022-11-08 ENCOUNTER — Encounter: Payer: Self-pay | Admitting: Family

## 2022-11-08 VITALS — BP 128/62 | HR 95 | Ht 63.0 in | Wt 149.0 lb

## 2022-11-08 DIAGNOSIS — E6609 Other obesity due to excess calories: Secondary | ICD-10-CM

## 2022-11-08 DIAGNOSIS — E559 Vitamin D deficiency, unspecified: Secondary | ICD-10-CM

## 2022-11-08 DIAGNOSIS — H938X2 Other specified disorders of left ear: Secondary | ICD-10-CM | POA: Diagnosis not present

## 2022-11-08 DIAGNOSIS — H9202 Otalgia, left ear: Secondary | ICD-10-CM | POA: Diagnosis not present

## 2022-11-08 DIAGNOSIS — Z6830 Body mass index (BMI) 30.0-30.9, adult: Secondary | ICD-10-CM

## 2022-11-08 DIAGNOSIS — G43119 Migraine with aura, intractable, without status migrainosus: Secondary | ICD-10-CM

## 2022-11-08 MED ORDER — NURTEC 75 MG PO TBDP: 1.0000 | ORAL_TABLET | ORAL | 3 refills | Status: DC | PRN

## 2022-11-08 MED ORDER — VITAMIN D (ERGOCALCIFEROL) 1.25 MG (50000 UNIT) PO CAPS: ORAL_CAPSULE | ORAL | 3 refills | Status: DC

## 2022-11-08 MED ORDER — PHENTERMINE HCL 37.5 MG PO TABS: 37.5000 mg | ORAL_TABLET | Freq: Every day | ORAL | 0 refills | Status: DC

## 2022-11-08 NOTE — Telephone Encounter (Signed)
Discussed at follow up

## 2022-11-08 NOTE — Assessment & Plan Note (Signed)
Checking labs today.  Will continue supplements as needed.  

## 2022-11-08 NOTE — Assessment & Plan Note (Signed)
Continue current meds.  Will adjust as needed based on results.  The patient is asked to make an attempt to improve diet and exercise patterns to aid in medical management of this problem. Addressed importance of increasing and maintaining water intake.   

## 2022-11-08 NOTE — Progress Notes (Signed)
Established Patient Office Visit  Subjective:  Patient ID: Stephanie Taylor, female    DOB: 1980-06-29  Age: 42 y.o. MRN: [id number]  Chief Complaint  Patient presents with   Follow-up    1 Month Follow Up   Patient is here today for her 1 month follow up.  She has been feeling fairly well since last appointment.   She does have additional concerns to discuss today.  Her migraines have improved with the changes in her diet and daily activities, but she says that she is still getting them on the left side.  She is concerned that they might be starting from her neck or her left ear, as she has been having pain there and says that it has been radiating to her head, then turning into a migraine.   Labs are not due today. She needs refills.   I have reviewed her active problem list, medication list, allergies, notes from last encounter, lab results for her appointment today.       No other concerns at this time.   Past Medical History:  Diagnosis Date   Anxiety    Vertigo 2013    No past surgical history on file.  Social History   Socioeconomic History   Marital status: Married    Spouse name: Not on file   Number of children: Not on file   Years of education: Not on file   Highest education level: Not on file  Occupational History   Not on file  Tobacco Use   Smoking status: Never   Smokeless tobacco: Never  Vaping Use   Vaping Use: Never used  Substance and Sexual Activity   Alcohol use: Yes    Alcohol/week: 14.0 standard drinks of alcohol    Types: 14 Glasses of wine per week   Drug use: No   Sexual activity: Yes    Partners: Male    Birth control/protection: I.U.D.  Other Topics Concern   Not on file  Social History Narrative   Not on file   Social Determinants of Health   Financial Resource Strain: Not on file  Food Insecurity: Not on file  Transportation Needs: Not on file  Physical Activity: Not on file  Stress: Not on file  Social  Connections: Not on file  Intimate Partner Violence: Not on file    Family History  Problem Relation Age of Onset   Diabetes Maternal Grandfather    Cancer Maternal Grandfather        skin   Heart attack Paternal Grandfather     Allergies  Allergen Reactions   Norgestimate-Eth Estradiol Nausea Only    Review of Systems  HENT:  Positive for ear pain (Left).   Gastrointestinal:  Positive for nausea.  Musculoskeletal:  Positive for neck pain.  Neurological:  Positive for headaches.  All other systems reviewed and are negative.      Objective:   BP 128/62   Pulse 95   Ht 5\' 3"  (1.6 m)   Wt 149 lb (67.6 kg)   SpO2 99%   BMI 26.39 kg/m   Vitals:   11/08/22 1103  BP: 128/62  Pulse: 95  Height: 5\' 3"  (1.6 m)  Weight: 149 lb (67.6 kg)  SpO2: 99%  BMI (Calculated): 26.4    Physical Exam Vitals and nursing note reviewed.  Constitutional:      Appearance: Normal appearance. She is normal weight.  HENT:     Head: Normocephalic.  Eyes:     Extraocular  Movements: Extraocular movements intact.     Conjunctiva/sclera: Conjunctivae normal.     Pupils: Pupils are equal, round, and reactive to light.  Cardiovascular:     Rate and Rhythm: Normal rate.  Pulmonary:     Effort: Pulmonary effort is normal.     Breath sounds: Normal breath sounds.  Neurological:     General: No focal deficit present.     Mental Status: She is alert and oriented to person, place, and time. Mental status is at baseline.  Psychiatric:        Mood and Affect: Mood normal.        Behavior: Behavior normal.        Thought Content: Thought content normal.      No results found for any visits on 11/08/22.  Recent Results (from the past 2160 hour(s))  Lipid panel     Status: None   Collection Time: 09/08/22  2:00 PM  Result Value Ref Range   Cholesterol, Total 161 100 - 199 mg/dL   Triglycerides 83 0 - 149 mg/dL   HDL 46 >13 mg/dL   VLDL Cholesterol Cal 16 5 - 40 mg/dL   LDL Chol Calc  (NIH) 99 0 - 99 mg/dL   Chol/HDL Ratio 3.5 0.0 - 4.4 ratio    Comment:                                   T. Chol/HDL Ratio                                             Men  Women                               1/2 Avg.Risk  3.4    3.3                                   Avg.Risk  5.0    4.4                                2X Avg.Risk  9.6    7.1                                3X Avg.Risk 23.4   11.0   VITAMIN D 25 Hydroxy (Vit-D Deficiency, Fractures)     Status: None   Collection Time: 09/08/22  2:00 PM  Result Value Ref Range   Vit D, 25-Hydroxy 48.2 30.0 - 100.0 ng/mL    Comment: Vitamin D deficiency has been defined by the Institute of Medicine and an Endocrine Society practice guideline as a level of serum 25-OH vitamin D less than 20 ng/mL (1,2). The Endocrine Society went on to further define vitamin D insufficiency as a level between 21 and 29 ng/mL (2). 1. IOM (Institute of Medicine). 2010. Dietary reference    intakes for calcium and D. [place]: The    [employer]. 2. Holick MF, Binkley [place], Bischoff-Ferrari HA, et al.    Evaluation, treatment, and prevention of vitamin D  deficiency: an Endocrine Society clinical practice    guideline. JCEM. 2011 Jul; 96(7):1911-30.   CBC With Differential     Status: None   Collection Time: 09/08/22  2:00 PM  Result Value Ref Range   WBC 7.2 3.4 - 10.8 x10E3/uL   RBC 4.25 3.77 - 5.28 x10E6/uL   Hemoglobin 11.9 11.1 - 15.9 g/dL   Hematocrit 16.1 09.6 - 46.6 %   MCV 86 79 - 97 fL   MCH 28.0 26.6 - 33.0 pg   MCHC 32.6 31.5 - 35.7 g/dL   RDW 04.5 40.9 - 81.1 %   Neutrophils 67 Not Estab. %   Lymphs 26 Not Estab. %   Monocytes 5 Not Estab. %   Eos 1 Not Estab. %   Basos 1 Not Estab. %   Neutrophils Absolute 4.8 1.4 - 7.0 x10E3/uL   Lymphocytes Absolute 1.9 0.7 - 3.1 x10E3/uL   Monocytes Absolute 0.4 0.1 - 0.9 x10E3/uL   EOS (ABSOLUTE) 0.1 0.0 - 0.4 x10E3/uL   Basophils Absolute 0.1 0.0 - 0.2 x10E3/uL   Immature  Granulocytes 0 Not Estab. %   Immature Grans (Abs) 0.0 0.0 - 0.1 x10E3/uL  CMP14+EGFR     Status: None   Collection Time: 09/08/22  2:00 PM  Result Value Ref Range   Glucose 80 70 - 99 mg/dL   BUN 15 6 - 24 mg/dL   Creatinine, Ser 9.14 0.57 - 1.00 mg/dL   eGFR 782 >95 AO/ZHY/8.65   BUN/Creatinine Ratio 22 9 - 23   Sodium 138 134 - 144 mmol/L   Potassium 4.2 3.5 - 5.2 mmol/L   Chloride 103 96 - 106 mmol/L   CO2 23 20 - 29 mmol/L   Calcium 9.3 8.7 - 10.2 mg/dL   Total Protein 6.4 6.0 - 8.5 g/dL   Albumin 4.3 3.9 - 4.9 g/dL   Globulin, Total 2.1 1.5 - 4.5 g/dL   Albumin/Globulin Ratio 2.0 1.2 - 2.2   Bilirubin Total <0.2 0.0 - 1.2 mg/dL   Alkaline Phosphatase 84 44 - 121 IU/L   AST 19 0 - 40 IU/L   ALT 19 0 - 32 IU/L  TSH     Status: None   Collection Time: 09/08/22  2:00 PM  Result Value Ref Range   TSH 2.470 0.450 - 4.500 uIU/mL  Hemoglobin A1c     Status: None   Collection Time: 09/08/22  2:00 PM  Result Value Ref Range   Hgb A1c MFr Bld 5.5 4.8 - 5.6 %    Comment:          Prediabetes: 5.7 - 6.4          Diabetes: >6.4          Glycemic control for adults with diabetes: <7.0    Est. average glucose Bld gHb Est-mCnc 111 mg/dL  Vitamin H84     Status: None   Collection Time: 09/08/22  2:00 PM  Result Value Ref Range   Vitamin B-12 CANCELED pg/mL    Comment: Test not performed. Due to technical problems in testing, a valid result could not be obtained. The remaining specimen was not sufficient for additional testing.  Result canceled by the ancillary.   FSH+Prog+E2+SHBG     Status: None   Collection Time: 09/08/22  2:00 PM  Result Value Ref Range   FSH 28.9 mIU/mL    Comment:  Adult Female             Range                       Follicular phase      3.5 -  12.5                       Ovulation phase       4.7 -  21.5                       Luteal phase          1.7 -   7.7                       Postmenopausal       25.8 - 134.8    Progesterone  0.2 ng/mL    Comment:                      Follicular phase       0.1 -   0.9                      Luteal phase           1.8 -  23.9                      Ovulation phase        0.1 -  12.0                      Pregnant                         First trimester    11.0 -  44.3                         Second trimester   25.4 -  83.3                         Third trimester    58.7 - 214.0                      Postmenopausal         0.0 -   0.1    Sex Hormone Binding 29.7 24.6 - 122.0 nmol/L   Estradiol 41.3 pg/mL    Comment:                      Adult Female             Range                       Follicular phase     12.5 - 166.0                       Ovulation phase      85.8 - 498.0                       Luteal phase         43.8 - 211.0                       Postmenopausal       <  6.0 -  54.7                      Pregnancy                       1st trimester     215.0 - >4300.0 Roche ECLIA methodology        Assessment & Plan:   Problem List Items Addressed This Visit       Active Problems   Migraines   Relevant Medications   NURTEC 75 MG TBDP   Class 1 obesity due to excess calories with serious comorbidity and body mass index (BMI) of 30.0 to 30.9 in adult    Continue current meds.  Will adjust as needed based on results.  The patient is asked to make an attempt to improve diet and exercise patterns to aid in medical management of this problem. Addressed importance of increasing and maintaining water intake.        Relevant Medications   phentermine (ADIPEX-P) 37.5 MG tablet   Vitamin D deficiency, unspecified    Checking labs today.  Will continue supplements as needed.        Relevant Medications   Vitamin D, Ergocalciferol, (DRISDOL) 1.25 MG (50000 UNIT) CAPS capsule   Other Visit Diagnoses     Left ear pain    -  Primary   Given that this has been occurring for several months now, I will send referral to ENT. Will defer to them for further changes.    Relevant Orders   Ambulatory referral to ENT   Ear pressure, left       Relevant Orders   Ambulatory referral to ENT       Return in about 3 months (around 02/08/2023) for F/U.   Total time spent: 30 minutes  Miki Kins, FNP  11/08/2022   This document may have been prepared by [facility] Voice Recognition software and as such may include unintentional dictation errors.

## 2022-12-01 ENCOUNTER — Other Ambulatory Visit: Payer: Self-pay | Admitting: Family

## 2022-12-29 ENCOUNTER — Other Ambulatory Visit: Payer: Self-pay | Admitting: Family

## 2022-12-29 DIAGNOSIS — E6609 Other obesity due to excess calories: Secondary | ICD-10-CM

## 2023-01-18 ENCOUNTER — Telehealth: Payer: Self-pay | Admitting: Family

## 2023-01-18 NOTE — Telephone Encounter (Signed)
Received FMLA paperwork via fax today for patient. Called and left VM that we need to know if this is a renewal for her migraines or for something new and we need absence dates. Will need an appt if not a renewal.If a renewal, no appt necessary but will still need the dates.

## 2023-01-24 NOTE — Telephone Encounter (Signed)
Spoke to patient and was informed this is her first time going for FMLA and will be finding out when her paperwork is due to ensure her upcoming appointment is in the timeframe given. If not patient will reschedule for an earlier date along with figuring out the date range she wants her FMLA paperwork to be for.

## 2023-01-26 ENCOUNTER — Ambulatory Visit: Payer: BC Managed Care – PPO | Admitting: Family

## 2023-01-26 ENCOUNTER — Encounter: Payer: Self-pay | Admitting: Family

## 2023-01-26 VITALS — BP 112/80 | HR 106 | Ht 63.0 in | Wt 151.6 lb

## 2023-01-26 DIAGNOSIS — Z0130 Encounter for examination of blood pressure without abnormal findings: Secondary | ICD-10-CM

## 2023-01-26 DIAGNOSIS — G43119 Migraine with aura, intractable, without status migrainosus: Secondary | ICD-10-CM

## 2023-02-10 ENCOUNTER — Ambulatory Visit: Payer: BC Managed Care – PPO | Admitting: Family

## 2023-02-15 ENCOUNTER — Other Ambulatory Visit: Payer: Self-pay | Admitting: Family

## 2023-02-15 DIAGNOSIS — E66811 Obesity, class 1: Secondary | ICD-10-CM

## 2023-03-04 ENCOUNTER — Encounter: Payer: Self-pay | Admitting: Family

## 2023-03-04 NOTE — Assessment & Plan Note (Signed)
Paperwork completed for patient today.  Will send refills for her meds to help with the migraines, has used nurtec in the past with effectiveness.   Reassess at follow up.

## 2023-03-04 NOTE — Progress Notes (Signed)
Established Patient Office Visit  Subjective:  Patient ID: Stephanie Taylor, female    DOB: 1981/02/13  Age: 42 y.o. MRN: [id number]  Chief Complaint  Patient presents with   Follow-up    Filling out paper work- due to migraines     Patient is here today for her 3 months follow up.  She has been feeling fairly well since last appointment.   She does have additional concerns to discuss today.  She has been having migraines that have been increasing in frequency, and she needs [place] to fill out paperwork for her to be able to take intermittent days off from work.   Labs are not due today. She needs refills.   I have reviewed her active problem list, medication list, allergies, notes from last encounter, lab results for her appointment today.      No other concerns at this time.   Past Medical History:  Diagnosis Date   Anxiety    Vertigo 2013    History reviewed. No pertinent surgical history.  Social History   Socioeconomic History   Marital status: Married    Spouse name: Not on file   Number of children: Not on file   Years of education: Not on file   Highest education level: Not on file  Occupational History   Not on file  Tobacco Use   Smoking status: Never   Smokeless tobacco: Never  Vaping Use   Vaping status: Never Used  Substance and Sexual Activity   Alcohol use: Yes    Alcohol/week: 14.0 standard drinks of alcohol    Types: 14 Glasses of wine per week   Drug use: No   Sexual activity: Yes    Partners: Male    Birth control/protection: I.U.D.  Other Topics Concern   Not on file  Social History Narrative   Not on file   Social Determinants of Health   Financial Resource Strain: Not on file  Food Insecurity: Not on file  Transportation Needs: Not on file  Physical Activity: Not on file  Stress: Not on file  Social Connections: Not on file  Intimate Partner Violence: Not on file    Family History  Problem Relation Age of Onset    Diabetes Maternal Grandfather    Cancer Maternal Grandfather        skin   Heart attack Paternal Grandfather     Allergies  Allergen Reactions   Norgestimate-Eth Estradiol Nausea Only    Review of Systems  All other systems reviewed and are negative.      Objective:   BP 112/80   Pulse (!) 106   Ht 5\' 3"  (1.6 m)   Wt 151 lb 9.6 oz (68.8 kg)   LMP 01/24/2023 Comment: every 2 weeks  SpO2 98%   BMI 26.85 kg/m   Vitals:   01/26/23 1017  BP: 112/80  Pulse: (!) 106  Height: 5\' 3"  (1.6 m)  Weight: 151 lb 9.6 oz (68.8 kg)  SpO2: 98%  BMI (Calculated): 26.86    Physical Exam Vitals and nursing note reviewed.  Constitutional:      Appearance: Normal appearance. She is normal weight.  HENT:     Head: Normocephalic.  Eyes:     Pupils: Pupils are equal, round, and reactive to light.  Cardiovascular:     Rate and Rhythm: Normal rate.  Pulmonary:     Effort: Pulmonary effort is normal.  Neurological:     General: No focal deficit present.  Mental Status: She is alert and oriented to person, place, and time. Mental status is at baseline.  Psychiatric:        Mood and Affect: Mood normal.        Behavior: Behavior normal.        Thought Content: Thought content normal.        Judgment: Judgment normal.      No results found for any visits on 01/26/23.  No results found for this or any previous visit (from the past 2160 hour(s)).     Assessment & Plan:   Problem List Items Addressed This Visit       Active Problems   Migraines - Primary    Paperwork completed for patient today.  Will send refills for her meds to help with the migraines, has used nurtec in the past with effectiveness.   Reassess at follow up.        Return in about 3 months (around 04/27/2023).   Total time spent: 20 minutes  Miki Kins, FNP  01/26/2023   This document may have been prepared by [facility] Voice Recognition software and as such may include unintentional  dictation errors.

## 2023-04-28 ENCOUNTER — Ambulatory Visit: Payer: BC Managed Care – PPO | Admitting: Family

## 2023-05-05 ENCOUNTER — Other Ambulatory Visit: Payer: Self-pay | Admitting: Family

## 2023-05-05 ENCOUNTER — Ambulatory Visit: Payer: BC Managed Care – PPO | Admitting: Family

## 2023-05-05 DIAGNOSIS — E6609 Other obesity due to excess calories: Secondary | ICD-10-CM

## 2023-06-06 ENCOUNTER — Other Ambulatory Visit: Payer: Self-pay | Admitting: Family

## 2023-06-08 ENCOUNTER — Ambulatory Visit: Payer: BC Managed Care – PPO | Admitting: Family

## 2023-06-12 ENCOUNTER — Other Ambulatory Visit: Payer: Self-pay | Admitting: Family

## 2023-06-12 DIAGNOSIS — G43009 Migraine without aura, not intractable, without status migrainosus: Secondary | ICD-10-CM

## 2023-06-12 DIAGNOSIS — E66811 Obesity, class 1: Secondary | ICD-10-CM

## 2023-06-15 ENCOUNTER — Ambulatory Visit: Payer: BC Managed Care – PPO | Admitting: Obstetrics and Gynecology

## 2023-06-15 ENCOUNTER — Encounter: Payer: Self-pay | Admitting: Obstetrics and Gynecology

## 2023-06-15 VITALS — BP 120/82 | HR 97 | Ht 63.0 in | Wt 151.0 lb

## 2023-06-15 DIAGNOSIS — K5900 Constipation, unspecified: Secondary | ICD-10-CM

## 2023-06-15 DIAGNOSIS — Z01419 Encounter for gynecological examination (general) (routine) without abnormal findings: Secondary | ICD-10-CM | POA: Diagnosis not present

## 2023-06-15 DIAGNOSIS — Z975 Presence of (intrauterine) contraceptive device: Secondary | ICD-10-CM

## 2023-06-15 NOTE — Progress Notes (Signed)
 Obstetrics and Gynecology Annual Patient Evaluation  Appointment Date: 06/15/2023  OBGYN Clinic: [facility]  Primary Care Provider: Miki Kins  Referring Provider: Miki Kins, FNP  Chief Complaint:  Chief Complaint  Patient presents with   Gynecologic Exam    History of Present Illness: Stephanie Taylor is a 43 y.o.  G1P1 (No LMP recorded. (Menstrual status: IUD).), seen for the above chief complaint.   For past year, patient has episodes of cramping, like menstrual cramps, outside of her period. Her periods are still about a week, not particularly heavy or painful and no AUB. She has a paragard in place since 2017.   Constipation with no BM since last week after recent norovirus bout.   Review of Systems: Pertinent items noted in HPI and remainder of comprehensive ROS otherwise negative.    Patient Active Problem List   Diagnosis Date Noted   Vitamin D deficiency, unspecified 11/08/2022   Class 1 obesity due to excess calories with serious comorbidity and body mass index (BMI) of 30.0 to 30.9 in adult 09/08/2022   Essential hypertension, benign 09/08/2022   Mixed hyperlipidemia 09/08/2022   Overweight 06/08/2022   Migraines 06/08/2022   IUD (intrauterine device) in place 01/03/2019   Preventative health care 04/28/2016    Past Medical History:  Past Medical History:  Diagnosis Date   Anxiety    Vertigo 2013    Past Surgical History:  History reviewed. No pertinent surgical history.  Past Obstetrical History:  OB History  Gravida Para Term Preterm AB Living  1 1    1   SAB IAB Ectopic Multiple Live Births      1    # Outcome Date GA Lbr Len/2nd Weight Sex Type Anes PTL Lv  1 Para 01/30/99    F Vag-Spont   LIV    Past Gynecological History: As per HPI. History of Pap Smear(s): Yes.   Last pap 2025, which was negative and hpv negative  Social History:  Social History   Socioeconomic History   Marital status:  Married    Spouse name: Not on file   Number of children: Not on file   Years of education: Not on file   Highest education level: Not on file  Occupational History   Not on file  Tobacco Use   Smoking status: Never   Smokeless tobacco: Never  Vaping Use   Vaping status: Never Used  Substance and Sexual Activity   Alcohol use: Yes    Alcohol/week: 14.0 standard drinks of alcohol    Types: 14 Glasses of wine per week   Drug use: No   Sexual activity: Yes    Partners: Male    Birth control/protection: I.U.D.  Other Topics Concern   Not on file  Social History Narrative   Not on file   Social Drivers of Health   Financial Resource Strain: Not on file  Food Insecurity: Not on file  Transportation Needs: Not on file  Physical Activity: Not on file  Stress: Not on file  Social Connections: Not on file  Intimate Partner Violence: Not on file    Family History:  Family History  Problem Relation Age of Onset   Diabetes Maternal Grandfather    Cancer Maternal Grandfather        skin   Heart attack Paternal Grandfather     Health Maintenance:  Mammogram(s): Yes.   Date: 08/2022 negative  Medications Trace Cederberg had no medications administered during this visit. Current  Outpatient Medications  Medication Sig Dispense Refill   azelastine (ASTELIN) 0.1 % nasal spray Place 1 spray into both nostrils 2 (two) times daily. Use in each nostril as directed 30 mL 12   DULoxetine (CYMBALTA) 30 MG capsule TAKE 1 CAPSULE BY MOUTH EVERY DAY 90 capsule 1   fexofenadine (ALLEGRA) 180 MG tablet Take 1 tablet (180 mg total) by mouth daily. 90 tablet 1   NURTEC 75 MG TBDP DISSOLVE 1 TABLET IN MOUTH AS NEEDED FOR MIGRAINE. MAX=1 TAB PER DAY. 8 tablet 5   PARAGARD INTRAUTERINE COPPER IU by Intrauterine route.     phentermine (ADIPEX-P) 37.5 MG tablet TAKE 1 TABLET BY MOUTH EVERY DAY BEFORE BREAKFAST 30 tablet 0   SODIUM FLUORIDE 5000 SENSITIVE 1.1-5 % GEL Take 100 mLs by mouth 2 (two)  times daily.     Vitamin D, Ergocalciferol, (DRISDOL) 1.25 MG (50000 UNIT) CAPS capsule TAKE 1 CAPSULE BY MOUTH ONE TIME PER WEEK 12 capsule 3   No current facility-administered medications for this visit.    Allergies Norgestimate-eth estradiol   Physical Exam:  BP 120/82   Pulse 97   Ht 5\' 3"  (1.6 m)   Wt 151 lb (68.5 kg)   BMI 26.75 kg/m  Body mass index is 26.75 kg/m. General appearance: Well nourished, well developed female in no acute distress.  Neck:  Supple, normal appearance, and no thyromegaly  Cardiovascular: normal s1 and s2.  No murmurs, rubs or gallops. Respiratory:  Clear to auscultation bilateral. Normal respiratory effort Abdomen: positive bowel sounds and no masses, hernias; diffusely non tender to palpation, non distended Breasts: breasts appear normal, no suspicious masses, no skin or nipple changes or axillary nodes, and negative palpation. Neuro/Psych:  Normal mood and affect.  Skin:  Warm and dry.  Lymphatic:  No inguinal lymphadenopathy.   Cervical and breast exam performed in the presence of a chaperone Pelvic exam: is not limited by body habitus EGBUS: within normal limits, clitoral hood piercing in place Vagina: within normal limits and with no blood or discharge in the vault Cervix: normal appearing cervix without tenderness, discharge or lesions. White iud strings approx 3-4cm  Uterus:  nonenlarged and non tender Adnexa:  normal adnexa and no mass, fullness, tenderness Rectovaginal: deferred  Laboratory: none  Radiology: none  Assessment: patient stable  Plan:  1. IUD (intrauterine device) in place (Primary) Since cramping for the past year, recommend early cycle u/s to check for placement. Patient declines swabs to check for infection - US PELVIC COMPLETE WITH TRANSVAGINAL; Future  2. Constipation, unspecified constipation type OTCs recommended, such as psyllium fiber  Orders Placed This Encounter  Procedures   US PELVIC COMPLETE WITH  TRANSVAGINAL    RTC: follow up with patient after u/s  Cornelia Copa MD Attending Center for [facility])

## 2023-06-15 NOTE — Progress Notes (Signed)
Patient presents for Annual.  Last pap: Date: 06/07/2022 WNL pt wants pap today.  Contraception: IUD:   Mammogram:  09/06/2022 STD Screening: Declines   CC:  Notes cramps when not on cycle, no dysuria. Constipation since last week. Recently got Norovirus.

## 2023-06-21 ENCOUNTER — Encounter: Payer: Self-pay | Admitting: Obstetrics and Gynecology

## 2023-06-30 ENCOUNTER — Ambulatory Visit: Payer: BC Managed Care – PPO

## 2023-08-17 ENCOUNTER — Ambulatory Visit: Admitting: Cardiology

## 2023-08-17 ENCOUNTER — Encounter: Payer: Self-pay | Admitting: Cardiology

## 2023-08-17 VITALS — BP 122/78 | HR 93 | Ht 62.0 in | Wt 149.0 lb

## 2023-08-17 DIAGNOSIS — H9202 Otalgia, left ear: Secondary | ICD-10-CM | POA: Diagnosis not present

## 2023-08-17 DIAGNOSIS — Z0130 Encounter for examination of blood pressure without abnormal findings: Secondary | ICD-10-CM

## 2023-08-17 MED ORDER — OFLOXACIN 0.3 % OT SOLN: 5.0000 [drp] | Freq: Two times a day (BID) | OTIC | 1 refills | Status: AC

## 2023-08-17 NOTE — Progress Notes (Signed)
 Established Patient Office Visit  Subjective:  Patient ID: Stephanie Taylor, female    DOB: [birth year]  Age: 43 y.o. MRN: [id number]  Chief Complaint  Patient presents with   Acute Visit    L Ear Pain    Patient in office for an acute visit, complaining of left ear pain. Patient states symptoms started over a week ago, ear pain, thought she had a boil in her ear. Patient reports having an at home otoscope that she uses to look in her ear, on her phone. Patient states she thought she had a boil in her ear that popped. Has had decreased hearing and tenderness. TM appears normal one exam. Ear canal erythematous, tender, discharge. Will send in antibiotic ear drops. Recommend patient stop picking in her ear.   Otalgia  There is pain in the left ear. This is a recurrent problem. The current episode started 1 to 4 weeks ago. The problem has been gradually worsening. There has been no fever. The pain is mild. Associated symptoms include ear discharge, hearing loss and rhinorrhea. Pertinent negatives include no abdominal pain, diarrhea or headaches. She has tried nothing for the symptoms. The treatment provided no relief.    No other concerns at this time.   Past Medical History:  Diagnosis Date   Anxiety    Vertigo 2013    History reviewed. No pertinent surgical history.  Social History   Socioeconomic History   Marital status: Married    Spouse name: Not on file   Number of children: Not on file   Years of education: Not on file   Highest education level: Not on file  Occupational History   Not on file  Tobacco Use   Smoking status: Never   Smokeless tobacco: Never  Vaping Use   Vaping status: Never Used  Substance and Sexual Activity   Alcohol use: Yes    Alcohol/week: 14.0 standard drinks of alcohol    Types: 14 Glasses of wine per week   Drug use: No   Sexual activity: Yes    Partners: Male    Birth control/protection: I.U.D.  Other Topics Concern   Not on file   Social History Narrative   Not on file   Social Drivers of Health   Financial Resource Strain: Not on file  Food Insecurity: Not on file  Transportation Needs: Not on file  Physical Activity: Not on file  Stress: Not on file  Social Connections: Not on file  Intimate Partner Violence: Not on file    Family History  Problem Relation Age of Onset   Diabetes Maternal Grandfather    Cancer Maternal Grandfather        skin   Heart attack Paternal Grandfather     Allergies  Allergen Reactions   Norgestimate-Eth Estradiol Nausea Only    Outpatient Medications Prior to Visit  Medication Sig   azelastine (ASTELIN) 0.1 % nasal spray Place 1 spray into both nostrils 2 (two) times daily. Use in each nostril as directed   DULoxetine (CYMBALTA) 30 MG capsule TAKE 1 CAPSULE BY MOUTH EVERY DAY   fexofenadine (ALLEGRA) 180 MG tablet Take 1 tablet (180 mg total) by mouth daily.   NURTEC 75 MG TBDP DISSOLVE 1 TABLET IN MOUTH AS NEEDED FOR MIGRAINE. MAX=1 TAB PER DAY.   PARAGARD INTRAUTERINE COPPER IU by Intrauterine route.   phentermine (ADIPEX-P) 37.5 MG tablet TAKE 1 TABLET BY MOUTH EVERY DAY BEFORE BREAKFAST   SODIUM FLUORIDE 5000 SENSITIVE 1.1-5 % GEL Take  100 mLs by mouth 2 (two) times daily.   Vitamin D, Ergocalciferol, (DRISDOL) 1.25 MG (50000 UNIT) CAPS capsule TAKE 1 CAPSULE BY MOUTH ONE TIME PER WEEK   No facility-administered medications prior to visit.    Review of Systems  Constitutional: Negative.   HENT:  Positive for ear discharge, ear pain, hearing loss and rhinorrhea.   Eyes: Negative.   Respiratory: Negative.  Negative for shortness of breath.   Cardiovascular: Negative.  Negative for chest pain.  Gastrointestinal: Negative.  Negative for abdominal pain, constipation and diarrhea.  Genitourinary: Negative.   Musculoskeletal:  Negative for joint pain and myalgias.  Skin: Negative.   Neurological: Negative.  Negative for dizziness and headaches.   Endo/Heme/Allergies: Negative.   All other systems reviewed and are negative.      Objective:   BP 122/78   Pulse 93   Ht 5\' 2"  (1.575 m)   Wt 149 lb (67.6 kg)   SpO2 98%   BMI 27.25 kg/m   Vitals:   08/17/23 1542  BP: 122/78  Pulse: 93  Height: 5\' 2"  (1.575 m)  Weight: 149 lb (67.6 kg)  SpO2: 98%  BMI (Calculated): 27.25    Physical Exam Vitals and nursing note reviewed.  Constitutional:      Appearance: Normal appearance. She is normal weight.  HENT:     Head: Normocephalic and atraumatic.     Right Ear: Tympanic membrane and ear canal normal. No decreased hearing noted. No drainage, swelling or tenderness.     Left Ear: Tympanic membrane normal. Decreased hearing noted. Drainage, swelling and tenderness present.     Nose: Nose normal.     Mouth/Throat:     Mouth: Mucous membranes are moist.  Eyes:     Extraocular Movements: Extraocular movements intact.     Conjunctiva/sclera: Conjunctivae normal.     Pupils: Pupils are equal, round, and reactive to light.  Cardiovascular:     Rate and Rhythm: Normal rate and regular rhythm.     Pulses: Normal pulses.     Heart sounds: Normal heart sounds.  Pulmonary:     Effort: Pulmonary effort is normal.     Breath sounds: Normal breath sounds.  Abdominal:     General: Abdomen is flat. Bowel sounds are normal.     Palpations: Abdomen is soft.  Musculoskeletal:        General: Normal range of motion.     Cervical back: Normal range of motion.  Skin:    General: Skin is warm and dry.  Neurological:     General: No focal deficit present.     Mental Status: She is alert and oriented to person, place, and time.  Psychiatric:        Mood and Affect: Mood normal.        Behavior: Behavior normal.        Thought Content: Thought content normal.        Judgment: Judgment normal.      No results found for any visits on 08/17/23.  No results found for this or any previous visit (from the past 2160 hours).     Assessment & Plan:  Ofloxacin  Problem List Items Addressed This Visit       Other   Otalgia of left ear - Primary    Return in about 2 weeks (around 08/31/2023) for with Marchelle Folks.   Total time spent: 25 minutes  [employer], NP  08/17/2023   This document may have been prepared by  Conservation officer, historic buildings and as such may include unintentional dictation errors.

## 2023-08-24 ENCOUNTER — Other Ambulatory Visit: Payer: Self-pay | Admitting: Family

## 2023-08-24 DIAGNOSIS — E66811 Obesity, class 1: Secondary | ICD-10-CM

## 2023-08-31 ENCOUNTER — Ambulatory Visit: Admitting: Family

## 2023-09-05 ENCOUNTER — Ambulatory Visit: Admitting: Family

## 2023-09-05 ENCOUNTER — Encounter: Payer: Self-pay | Admitting: Family

## 2023-09-05 VITALS — BP 108/78 | HR 78 | Ht 63.0 in | Wt 151.2 lb

## 2023-09-05 DIAGNOSIS — I10 Essential (primary) hypertension: Secondary | ICD-10-CM

## 2023-09-05 DIAGNOSIS — R7303 Prediabetes: Secondary | ICD-10-CM

## 2023-09-05 DIAGNOSIS — E782 Mixed hyperlipidemia: Secondary | ICD-10-CM

## 2023-09-05 DIAGNOSIS — E559 Vitamin D deficiency, unspecified: Secondary | ICD-10-CM | POA: Diagnosis not present

## 2023-09-05 DIAGNOSIS — R5383 Other fatigue: Secondary | ICD-10-CM | POA: Diagnosis not present

## 2023-09-05 DIAGNOSIS — E538 Deficiency of other specified B group vitamins: Secondary | ICD-10-CM | POA: Diagnosis not present

## 2023-09-05 NOTE — Progress Notes (Signed)
 Established Patient Office Visit  Subjective:  Patient ID: Stephanie Taylor, female    DOB: Feb 04, 1981  Age: 43 y.o. MRN: [id number]  Chief Complaint  Patient presents with   Follow-up    2 week follow up    Patient is here today for her 5 months follow up.  She has been feeling fairly well since last appointment.   She does have additional concerns to discuss today.  Still fighting migraines, left side, up neck.   They called to schedule her ultrasound, but she says they were going to charge her thousands to have an ultrasound. I do not think this is right, I will work to figure out what the cost issue is.   Labs are due today.  She needs refills.   I have reviewed her active problem list, medication list, allergies, notes from last encounter, lab results for her appointment today.    No other concerns at this time.   Past Medical History:  Diagnosis Date   Anxiety    Vertigo 2013    History reviewed. No pertinent surgical history.  Social History   Socioeconomic History   Marital status: Married    Spouse name: Not on file   Number of children: Not on file   Years of education: Not on file   Highest education level: Not on file  Occupational History   Not on file  Tobacco Use   Smoking status: Never   Smokeless tobacco: Never  Vaping Use   Vaping status: Never Used  Substance and Sexual Activity   Alcohol use: Yes    Alcohol/week: 14.0 standard drinks of alcohol    Types: 14 Glasses of wine per week   Drug use: No   Sexual activity: Yes    Partners: Male    Birth control/protection: I.U.D.  Other Topics Concern   Not on file  Social History Narrative   Not on file   Social Drivers of Health   Financial Resource Strain: Not on file  Food Insecurity: Not on file  Transportation Needs: Not on file  Physical Activity: Not on file  Stress: Not on file  Social Connections: Not on file  Intimate Partner Violence: Not on file    Family History   Problem Relation Age of Onset   Diabetes Maternal Grandfather    Cancer Maternal Grandfather        skin   Heart attack Paternal Grandfather     Allergies  Allergen Reactions   Norgestimate-Eth Estradiol Nausea Only    Review of Systems  All other systems reviewed and are negative.      Objective:   BP 108/78   Pulse 78   Ht 5' 3 (1.6 m)   Wt 151 lb 3.2 oz (68.6 kg)   SpO2 99%   BMI 26.78 kg/m   Vitals:   09/05/23 1024  BP: 108/78  Pulse: 78  Height: 5' 3 (1.6 m)  Weight: 151 lb 3.2 oz (68.6 kg)  SpO2: 99%  BMI (Calculated): 26.79    Physical Exam Vitals and nursing note reviewed.  Constitutional:      Appearance: Normal appearance. She is normal weight.  HENT:     Head: Normocephalic.  Eyes:     Extraocular Movements: Extraocular movements intact.     Conjunctiva/sclera: Conjunctivae normal.     Pupils: Pupils are equal, round, and reactive to light.  Cardiovascular:     Rate and Rhythm: Normal rate.  Pulmonary:     Effort: Pulmonary  effort is normal.  Neurological:     General: No focal deficit present.     Mental Status: She is alert and oriented to person, place, and time. Mental status is at baseline.  Psychiatric:        Mood and Affect: Mood normal.        Behavior: Behavior normal.        Thought Content: Thought content normal.      No results found for any visits on 09/05/23.  No results found for this or any previous visit (from the past 2160 hours).     Assessment & Plan Essential hypertension, benign Blood pressure well controlled with current medications.  Continue current therapy.  Will reassess at follow up.   - CBC w/Diff - CMP w/eGFR  Vitamin D  deficiency, unspecified B12 deficiency due to diet Other fatigue Checking labs today.  Will continue supplements as needed.   - Vitamin D  - Vitamin B12 - TSH  Mixed hyperlipidemia Checking labs today.  Continue current therapy for lipid control. Will modify as needed  based on labwork results.   -CMP w/eGFR -Lipid Panel  Prediabetes A1C Continues to be in prediabetic ranges.  Will reassess at follow up after next lab check.  Patient counseled on dietary choices and verbalized understanding.   -CBC w/Diff -CMP w/eGFR -Hemoglobin A1C    Will check on the US  for her, give her a call if I am able to find anything out.   Return in about 4 months (around 01/06/2024).   Total time spent: 20 minutes  ALAN CHRISTELLA ARRANT, FNP  09/05/2023   This document may have been prepared by [facility] Voice Recognition software and as such may include unintentional dictation errors.

## 2023-09-11 ENCOUNTER — Ambulatory Visit

## 2023-11-13 ENCOUNTER — Other Ambulatory Visit: Payer: Self-pay | Admitting: Family

## 2023-11-13 DIAGNOSIS — E66811 Obesity, class 1: Secondary | ICD-10-CM

## 2023-11-30 ENCOUNTER — Encounter: Payer: Self-pay | Admitting: Family

## 2023-11-30 NOTE — Assessment & Plan Note (Signed)
 Checking labs today.  Continue current therapy for lipid control. Will modify as needed based on labwork results.   -CMP w/eGFR -Lipid Panel

## 2023-11-30 NOTE — Assessment & Plan Note (Signed)
 Checking labs today.  Will continue supplements as needed.   - Vitamin D  - Vitamin B12 - TSH

## 2023-11-30 NOTE — Assessment & Plan Note (Signed)
 Blood pressure well controlled with current medications.  Continue current therapy.  Will reassess at follow up.   - CBC w/Diff - CMP w/eGFR

## 2023-12-14 ENCOUNTER — Other Ambulatory Visit: Payer: Self-pay | Admitting: Family

## 2024-01-30 ENCOUNTER — Telehealth: Payer: Self-pay | Admitting: Family

## 2024-01-30 NOTE — Telephone Encounter (Signed)
 Left patient VM that FMLA paperwork is complete but needs the patient's signature before I can submit it via fax.

## 2024-02-04 ENCOUNTER — Other Ambulatory Visit: Payer: Self-pay | Admitting: Family

## 2024-02-04 DIAGNOSIS — E559 Vitamin D deficiency, unspecified: Secondary | ICD-10-CM

## 2024-03-05 ENCOUNTER — Other Ambulatory Visit: Payer: Self-pay | Admitting: Family

## 2024-03-05 DIAGNOSIS — E66811 Obesity, class 1: Secondary | ICD-10-CM

## 2024-03-08 ENCOUNTER — Ambulatory Visit: Admitting: Obstetrics and Gynecology

## 2024-03-14 ENCOUNTER — Other Ambulatory Visit (INDEPENDENT_AMBULATORY_CARE_PROVIDER_SITE_OTHER): Payer: Self-pay

## 2024-03-14 ENCOUNTER — Ambulatory Visit: Admitting: Obstetrics and Gynecology

## 2024-03-14 VITALS — BP 113/81 | HR 78 | Wt 156.0 lb

## 2024-03-14 DIAGNOSIS — R102 Pelvic and perineal pain unspecified side: Secondary | ICD-10-CM | POA: Diagnosis not present

## 2024-03-14 DIAGNOSIS — Z975 Presence of (intrauterine) contraceptive device: Secondary | ICD-10-CM | POA: Diagnosis not present

## 2024-03-14 DIAGNOSIS — N939 Abnormal uterine and vaginal bleeding, unspecified: Secondary | ICD-10-CM

## 2024-03-14 DIAGNOSIS — Z30431 Encounter for routine checking of intrauterine contraceptive device: Secondary | ICD-10-CM

## 2024-03-14 NOTE — Progress Notes (Signed)
 Patient has not had Pelvic US  done yet due to insurance not covering enough of the price.  She was told it would cost a couple of thousand dollars.    Migraines in the mornings; cramping is still an issue and she had a heavy period starting 03/02/24.

## 2024-03-18 NOTE — Progress Notes (Signed)
 Obstetrics and Gynecology Visit Return Patient Evaluation  Appointment Date: 03/14/2024  Primary Care Provider: Orlean Alan HERO  OBGYN Clinic: [facility]  Chief Complaint: Follow up  History of Present Illness:  Stephanie Taylor is a 43 y.o. cramping with paragard  IUD in place. Patient seen Feb 2025 for an annual and stated that she had had cramping for about the past year; exam negative with normal IUD strings; patient declined swabs and u/s ordered with follow up after that   Interval History: Since that time, she states that occasional cramping, occasional scant AUB. Would like to do an u/s in office due to costs  Review of Systems: as noted in the History of Present Illness.  Patient Active Problem List   Diagnosis Date Noted   Otalgia of left ear 08/17/2023   Vitamin D  deficiency, unspecified 11/08/2022   Class 1 obesity due to excess calories with serious comorbidity and body mass index (BMI) of 30.0 to 30.9 in adult 09/08/2022   Essential hypertension, benign 09/08/2022   Mixed hyperlipidemia 09/08/2022   Overweight 06/08/2022   Migraines 06/08/2022   IUD (intrauterine device) in place 01/03/2019   Preventative health care 04/28/2016   Medications:  Oveta Idris had no medications administered during this visit. Current Outpatient Medications  Medication Sig Dispense Refill   DULoxetine (CYMBALTA) 30 MG capsule TAKE 1 CAPSULE BY MOUTH EVERY DAY 90 capsule 1   NURTEC 75 MG TBDP DISSOLVE 1 TABLET IN MOUTH AS NEEDED FOR MIGRAINE. MAX=1 TAB PER DAY. 8 tablet 5   PARAGARD  INTRAUTERINE COPPER  IU by Intrauterine route.     phentermine  (ADIPEX-P ) 37.5 MG tablet TAKE 1 TABLET BY MOUTH EVERY DAY BEFORE BREAKFAST 30 tablet 1   SODIUM FLUORIDE 5000 SENSITIVE 1.1-5 % GEL Take 100 mLs by mouth 2 (two) times daily.     Vitamin D , Ergocalciferol , (DRISDOL ) 1.25 MG (50000 UNIT) CAPS capsule TAKE 1 CAPSULE BY MOUTH ONE TIME PER WEEK 12 capsule 3    azelastine  (ASTELIN ) 0.1 % nasal spray Place 1 spray into both nostrils 2 (two) times daily. Use in each nostril as directed (Patient not taking: Reported on 09/05/2023) 30 mL 12   fexofenadine  (ALLEGRA ) 180 MG tablet Take 1 tablet (180 mg total) by mouth daily. (Patient not taking: Reported on 09/05/2023) 90 tablet 1   No current facility-administered medications for this visit.    Allergies: is allergic to norgestimate-eth estradiol.  Physical Exam:  BP 113/81   Pulse 78   Wt 156 lb (70.8 kg)   LMP 03/02/2024 (Approximate) Comment: spotting when wiping first, then full on period  BMI 27.63 kg/m  Body mass index is 27.63 kg/m. General appearance: Well nourished, well developed female in no acute distress.  Abdomen: diffusely non tender to palpation, non distended, and no masses, hernias Neuro/Psych:  Normal mood and affect.    Radiology: chaperone present. I told her that it looks like the right arm may be malpositioned/slightly embedded on TVUS  Assessment: patient stable  Plan:  1. Pelvic pain (Primary) I told her I recommend removing the IUD and getting a formal scan but she states s/s are that bothersome and elects to keep it for now  2. IUD (intrauterine device) in place  3. Abnormal uterine bleeding (AUB)   RTC: PRN   Bebe Izell Raddle MD Attending Center for [facility])
